# Patient Record
Sex: Male | Born: 1960 | Race: Black or African American | Hispanic: No | Smoking: Never smoker
Health system: Southern US, Community
[De-identification: ages and names within clinical notes are randomized; demographics above are authoritative.]

## PROBLEM LIST (undated history)

## (undated) DIAGNOSIS — E785 Hyperlipidemia, unspecified: Secondary | ICD-10-CM

## (undated) DIAGNOSIS — E663 Overweight: Secondary | ICD-10-CM

## (undated) DIAGNOSIS — Z9079 Acquired absence of other genital organ(s): Secondary | ICD-10-CM

## (undated) DIAGNOSIS — E559 Vitamin D deficiency, unspecified: Secondary | ICD-10-CM

## (undated) DIAGNOSIS — T7840XA Allergy, unspecified, initial encounter: Secondary | ICD-10-CM

## (undated) DIAGNOSIS — C801 Malignant (primary) neoplasm, unspecified: Secondary | ICD-10-CM

## (undated) HISTORY — DX: Allergy, unspecified, initial encounter: T78.40XA

## (undated) HISTORY — PX: PENILE PROSTHESIS IMPLANT: SHX240

## (undated) HISTORY — DX: Malignant (primary) neoplasm, unspecified: C80.1

## (undated) HISTORY — DX: Overweight: E66.3

## (undated) HISTORY — DX: Acquired absence of other genital organ(s): Z90.79

## (undated) HISTORY — DX: Vitamin D deficiency, unspecified: E55.9

## (undated) HISTORY — PX: TONSILLECTOMY: SUR1361

## (undated) HISTORY — DX: Hyperlipidemia, unspecified: E78.5

## (undated) HISTORY — PX: PROSTATECTOMY: SHX69

## (undated) HISTORY — PX: COLONOSCOPY: SHX174

## (undated) HISTORY — PX: TONSILECTOMY/ADENOIDECTOMY WITH MYRINGOTOMY: SHX6125

---

## 1999-11-18 ENCOUNTER — Encounter: Payer: Self-pay | Admitting: *Deleted

## 1999-11-18 ENCOUNTER — Ambulatory Visit (HOSPITAL_COMMUNITY): Admission: RE | Admit: 1999-11-18 | Discharge: 1999-11-18 | Payer: Self-pay | Admitting: *Deleted

## 2006-06-29 ENCOUNTER — Ambulatory Visit: Payer: Self-pay | Admitting: General Surgery

## 2006-09-18 DIAGNOSIS — C801 Malignant (primary) neoplasm, unspecified: Secondary | ICD-10-CM

## 2006-09-18 HISTORY — PX: PENILE PROSTHESIS IMPLANT: SHX240

## 2006-09-18 HISTORY — DX: Malignant (primary) neoplasm, unspecified: C80.1

## 2006-09-18 HISTORY — PX: LASER OF PROSTATE W/ GREEN LIGHT PVP: SHX1953

## 2007-09-19 HISTORY — PX: CYST EXCISION: SHX5701

## 2008-05-17 ENCOUNTER — Other Ambulatory Visit: Payer: Self-pay

## 2008-05-17 ENCOUNTER — Emergency Department: Payer: Self-pay | Admitting: Emergency Medicine

## 2011-07-31 LAB — HM COLONOSCOPY: HM Colonoscopy: NORMAL

## 2012-05-23 DIAGNOSIS — N529 Male erectile dysfunction, unspecified: Secondary | ICD-10-CM | POA: Insufficient documentation

## 2014-06-12 LAB — LIPID PANEL
Cholesterol: 116 mg/dL (ref 0–200)
HDL: 44 mg/dL (ref 35–70)
LDL Cholesterol: 62 mg/dL
Triglycerides: 48 mg/dL (ref 40–160)

## 2014-06-17 ENCOUNTER — Encounter: Payer: Self-pay | Admitting: *Deleted

## 2014-06-24 ENCOUNTER — Ambulatory Visit: Payer: Self-pay | Admitting: General Surgery

## 2014-07-16 ENCOUNTER — Ambulatory Visit (INDEPENDENT_AMBULATORY_CARE_PROVIDER_SITE_OTHER): Payer: BC Managed Care – PPO | Admitting: General Surgery

## 2014-07-16 ENCOUNTER — Encounter: Payer: Self-pay | Admitting: General Surgery

## 2014-07-16 VITALS — BP 124/70 | HR 74 | Resp 16 | Ht 69.0 in | Wt 182.0 lb

## 2014-07-16 DIAGNOSIS — L723 Sebaceous cyst: Secondary | ICD-10-CM

## 2014-07-16 MED ORDER — DOXYCYCLINE HYCLATE 100 MG PO CAPS: 100.0000 mg | ORAL_CAPSULE | Freq: Two times a day (BID) | ORAL | Status: DC

## 2014-07-16 NOTE — Progress Notes (Signed)
Patient ID: Jeffrey Harding, male   DOB: 1961/03/25, 53 y.o.   MRN: [id number]  Chief Complaint  Patient presents with  . Other    cyst on back    HPI Jeffrey Harding is a 53 y.o. male.  Here today for evaluation of a cyst on his back. He states it has been there for about 10 years. He states that over the past 6 months it is getting larger. His wife opened it up and it drained. He has had a cyst removed but it was lower on his back. He states it does drain occasionally.  HPI  Past Medical History  Diagnosis Date  . Cancer 2008    prostate  . Hyperlipidemia   . Allergy     Past Surgical History  Procedure Laterality Date  . Laser of prostate w/ green light pvp  2008  . Cyst excision  2009    back    History reviewed. No pertinent family history.  Social History History  Substance Use Topics  . Smoking status: Never Smoker   . Smokeless tobacco: Never Used  . Alcohol Use: Yes     Comment: occasionally    No Known Allergies  Current Outpatient Prescriptions  Medication Sig Dispense Refill  . atorvastatin (LIPITOR) 40 MG tablet       . fluticasone (FLONASE) 50 MCG/ACT nasal spray Place 1 spray into both nostrils daily.      Marland Kitchen loratadine (ALLERGY) 10 MG tablet Take 10 mg by mouth daily.      Marland Kitchen doxycycline (VIBRAMYCIN) 100 MG capsule Take 1 capsule (100 mg total) by mouth 2 (two) times daily.  20 capsule  0   No current facility-administered medications for this visit.    Review of Systems Review of Systems  Constitutional: Negative.   Respiratory: Negative.   Cardiovascular: Negative.     Blood pressure 124/70, pulse 74, resp. rate 16, height 5\' 9"  (1.753 m), weight 182 lb (82.555 kg).  Physical Exam Physical Exam  Constitutional: He is oriented to person, place, and time. He appears well-developed and well-nourished.  Eyes: Conjunctivae are normal. No scleral icterus.  Neck: Neck supple.  Cardiovascular: Normal rate, regular rhythm and normal heart sounds.    Pulmonary/Chest: Effort normal and breath sounds normal.  Lymphadenopathy:    He has no cervical adenopathy.  Neurological: He is alert and oriented to person, place, and time.  Skin: Skin is warm and dry.  2 cm cyst mid back with mild induration.    Data Reviewed none  Assessment    Skin cyst.    Plan    Reevaluate in 6 weeks and consider excision.       SANKAR,SEEPLAPUTHUR G 07/16/2014, 8:00 PM

## 2014-07-16 NOTE — Patient Instructions (Signed)
The patient is aware to call back for any questions or concerns.  

## 2014-08-27 ENCOUNTER — Ambulatory Visit: Payer: BC Managed Care – PPO | Admitting: General Surgery

## 2014-09-24 ENCOUNTER — Ambulatory Visit: Payer: BC Managed Care – PPO | Admitting: General Surgery

## 2014-10-08 ENCOUNTER — Encounter: Payer: Self-pay | Admitting: General Surgery

## 2014-10-08 ENCOUNTER — Ambulatory Visit (INDEPENDENT_AMBULATORY_CARE_PROVIDER_SITE_OTHER): Payer: BLUE CROSS/BLUE SHIELD | Admitting: General Surgery

## 2014-10-08 VITALS — BP 138/72 | HR 70 | Resp 12 | Ht 69.0 in | Wt 186.0 lb

## 2014-10-08 DIAGNOSIS — L723 Sebaceous cyst: Secondary | ICD-10-CM

## 2014-10-08 NOTE — Patient Instructions (Addendum)
Monitor the area if symptoms return call for an appointment for reevaluation.

## 2014-10-08 NOTE — Progress Notes (Signed)
He is here today for excision of a cyst on his back. No new complaints.   The area has completely resolved. No palpable or visible findings in that area.  Monitor the area if he notices the swelling occuring or symptoms return call for an appointment for reevaluation.

## 2015-05-25 ENCOUNTER — Encounter: Payer: Self-pay | Admitting: Family Medicine

## 2015-06-18 ENCOUNTER — Encounter: Payer: Self-pay | Admitting: Family Medicine

## 2015-06-18 ENCOUNTER — Ambulatory Visit (INDEPENDENT_AMBULATORY_CARE_PROVIDER_SITE_OTHER): Payer: BLUE CROSS/BLUE SHIELD | Admitting: Family Medicine

## 2015-06-18 VITALS — BP 108/56 | HR 80 | Temp 98.1°F | Resp 18 | Ht 68.5 in | Wt 174.4 lb

## 2015-06-18 DIAGNOSIS — Z8546 Personal history of malignant neoplasm of prostate: Secondary | ICD-10-CM | POA: Diagnosis not present

## 2015-06-18 DIAGNOSIS — Z0000 Encounter for general adult medical examination without abnormal findings: Secondary | ICD-10-CM | POA: Diagnosis not present

## 2015-06-18 DIAGNOSIS — Z9079 Acquired absence of other genital organ(s): Secondary | ICD-10-CM | POA: Insufficient documentation

## 2015-06-18 DIAGNOSIS — J3089 Other allergic rhinitis: Secondary | ICD-10-CM

## 2015-06-18 DIAGNOSIS — Z79899 Other long term (current) drug therapy: Secondary | ICD-10-CM | POA: Diagnosis not present

## 2015-06-18 DIAGNOSIS — E785 Hyperlipidemia, unspecified: Secondary | ICD-10-CM | POA: Diagnosis not present

## 2015-06-18 DIAGNOSIS — R634 Abnormal weight loss: Secondary | ICD-10-CM | POA: Diagnosis not present

## 2015-06-18 DIAGNOSIS — J302 Other seasonal allergic rhinitis: Secondary | ICD-10-CM | POA: Insufficient documentation

## 2015-06-18 DIAGNOSIS — E559 Vitamin D deficiency, unspecified: Secondary | ICD-10-CM | POA: Diagnosis not present

## 2015-06-18 MED ORDER — ASPIRIN EC 81 MG PO TBEC: 81.0000 mg | DELAYED_RELEASE_TABLET | Freq: Every day | ORAL | Status: DC

## 2015-06-18 MED ORDER — ATORVASTATIN CALCIUM 40 MG PO TABS
40.0000 mg | ORAL_TABLET | Freq: Every day | ORAL | Status: DC
Start: 2015-06-18 — End: 2016-07-04

## 2015-06-18 NOTE — Progress Notes (Signed)
Name: Jeffrey Harding   MRN: [id number]    DOB: 04-07-1961   Date:06/18/2015       Progress Note  Subjective  Chief Complaint  Chief Complaint  Patient presents with  . Annual Exam    HPI  Male Physical: he feels tired, his mother is in a nursing home in [place], has a stressful job, and sometimes does not get enough hour of sleep other times wakes up not feeling rested.   Hyperlipidemia: taking Atorvastatin daily and denies side effects. He denies chest pain or sob, no myalgia  ED: he has a penile pump, secondary to prostatectomy during treatment of prostate cancer  AR; currently not using medication, symptoms are worse in the Spring. Symptoms are usually nasal congestion, no rhinorrhea or itching.    Patient Active Problem List   Diagnosis Date Noted  . Allergic rhinitis 06/18/2015  . H/O malignant neoplasm of prostate 06/18/2015  . History of prostatectomy 06/18/2015  . Vitamin D deficiency 06/18/2015  . Dyslipidemia 06/18/2015  . ED (erectile dysfunction) of organic origin 05/23/2012    Past Surgical History  Procedure Laterality Date  . Laser of prostate w/ green light pvp  2008  . Cyst excision  2009    back  . Penile prosthesis implant    . Prostatectomy      Family History  Problem Relation Age of Onset  . Hypertension Mother   . Hyperlipidemia Mother   . Heart disease Mother   . Diabetes Mother   . Heart disease Father   . Hyperlipidemia Father   . Hypertension Father   . Cancer Father     prostate  . Hypertension Sister   . Hyperlipidemia Sister     Social History   Social History  . Marital Status: Married    Spouse Name: N/A  . Number of Children: N/A  . Years of Education: N/A   Occupational History  . Not on file.   Social History Main Topics  . Smoking status: Never Smoker   . Smokeless tobacco: Never Used  . Alcohol Use: 0.0 oz/week    0 Standard drinks or equivalent per week     Comment: rarely  . Drug Use: No  . Sexual  Activity:    Partners: Female   Other Topics Concern  . Not on file   Social History Narrative     Current outpatient prescriptions:  .  atorvastatin (LIPITOR) 40 MG tablet, Take 1 tablet (40 mg total) by mouth daily., Disp: 90 tablet, Rfl: 3 .  aspirin EC 81 MG tablet, Take 1 tablet (81 mg total) by mouth daily., Disp: 30 tablet, Rfl: 0 .  fluticasone (FLONASE) 50 MCG/ACT nasal spray, Place 1 spray into both nostrils daily., Disp: , Rfl:  .  loratadine (ALLERGY) 10 MG tablet, Take 10 mg by mouth daily., Disp: , Rfl:   No Known Allergies   ROS  Constitutional: Negative for fever positive for  weight change ( lost 10 lbs since last visit - he states he has changed his diet and is exercising between 2  -4 times weekly .  Respiratory: Negative for cough and shortness of breath.   Cardiovascular: Negative for chest pain or palpitations.  Gastrointestinal: Negative for abdominal pain, no bowel changes.  Musculoskeletal: Negative for gait problem or joint swelling.  Skin: Negative for rash.  Neurological: Negative for dizziness or headache.  No other specific complaints in a complete review of systems (except as listed in HPI above).  Objective  Filed Vitals:   06/18/15 1423  BP: 108/56  Pulse: 80  Temp: 98.1 F (36.7 C)  TempSrc: Oral  Resp: 18  Height: 5' 8.5" (1.74 m)  Weight: 174 lb 6.4 oz (79.107 kg)  SpO2: 98%    Body mass index is 26.13 kg/(m^2).  Physical Exam  Constitutional: Patient appears well-developed and well-nourished. No distress.  HENT: Head: Normocephalic and atraumatic. Ears: B TMs ok, no erythema or effusion; Nose: Nose normal. Mouth/Throat: Oropharynx is clear and moist. No oropharyngeal exudate.  Eyes: Conjunctivae and EOM are normal. Pupils are equal, round, and reactive to light. No scleral icterus.  Neck: Normal range of motion. Neck supple. No JVD present. No thyromegaly present.  Cardiovascular: Normal rate, regular rhythm and normal heart  sounds.  No murmur heard. No BLE edema. Pulmonary/Chest: Effort normal and breath sounds normal. No respiratory distress. Abdominal: Soft. Bowel sounds are normal, no distension. There is no tenderness. no masses MALE GENITALIA: sees Urologist  RECTAL: seen by Urologist  Musculoskeletal: Normal range of motion, no joint effusions. No gross deformities Neurological: he is alert and oriented to person, place, and time. No cranial nerve deficit. Coordination, balance, strength, speech and gait are normal.  Skin: Skin is warm and dry. No rash noted. No erythema.  Psychiatric: Patient has a normal mood and affect. behavior is normal. Judgment and thought content normal.  PHQ2/9: Depression screen PHQ 2/9 06/18/2015  Decreased Interest 0  Down, Depressed, Hopeless 0  PHQ - 2 Score 0     Fall Risk: Fall Risk  06/18/2015  Falls in the past year? No    Functional Status Survey: Is the patient deaf or have difficulty hearing?: No Does the patient have difficulty seeing, even when wearing glasses/contacts?: Yes (reading glasses) Does the patient have difficulty concentrating, remembering, or making decisions?: No Does the patient have difficulty walking or climbing stairs?: No Does the patient have difficulty dressing or bathing?: No Does the patient have difficulty doing errands alone such as visiting a doctor's office or shopping?: No    Assessment & Plan  1. Encounter for routine history and physical exam for male  Discussed importance of 150 minutes of physical activity weekly, eat two servings of fish weekly, eat one serving of tree nuts ( cashews, pistachios, pecans, almonds.Marland Kitchen) every other day, eat 6 servings of fruit/vegetables daily and drink plenty of water and avoid sweet beverages.   2. H/O malignant neoplasm of prostate  Going to [facility] to see Oncologist and Urologist yearly   3. Vitamin D deficiency  - Vit D  25 hydroxy (rtn osteoporosis monitoring)  4. Dyslipidemia  -  atorvastatin (LIPITOR) 40 MG tablet; Take 1 tablet (40 mg total) by mouth daily.  Dispense: 90 tablet; Refill: 3 - Lipid panel  5. Weight loss  - TSH - CBC with Differential/Platelet  6. Felicetti-term use of high-risk medication  - Comprehensive metabolic panel

## 2015-06-19 LAB — COMPREHENSIVE METABOLIC PANEL
A/G RATIO: 1.6 (ref 1.1–2.5)
ALBUMIN: 4.2 g/dL (ref 3.5–5.5)
ALT: 27 IU/L (ref 0–44)
AST: 18 IU/L (ref 0–40)
Alkaline Phosphatase: 78 IU/L (ref 39–117)
BUN / CREAT RATIO: 9 (ref 9–20)
BUN: 9 mg/dL (ref 6–24)
Bilirubin Total: 0.7 mg/dL (ref 0.0–1.2)
CALCIUM: 9.1 mg/dL (ref 8.7–10.2)
CO2: 26 mmol/L (ref 18–29)
CREATININE: 0.96 mg/dL (ref 0.76–1.27)
Chloride: 99 mmol/L (ref 97–108)
GFR calc Af Amer: 102 mL/min/{1.73_m2} (ref >59)
GFR, EST NON AFRICAN AMERICAN: 89 mL/min/{1.73_m2} (ref >59)
GLOBULIN, TOTAL: 2.7 g/dL (ref 1.5–4.5)
Glucose: 93 mg/dL (ref 65–99)
POTASSIUM: 3.9 mmol/L (ref 3.5–5.2)
SODIUM: 139 mmol/L (ref 134–144)
Total Protein: 6.9 g/dL (ref 6.0–8.5)

## 2015-06-19 LAB — CBC WITH DIFFERENTIAL/PLATELET
BASOS ABS: 0 10*3/uL (ref 0.0–0.2)
Basos: 1 %
EOS (ABSOLUTE): 0 10*3/uL (ref 0.0–0.4)
Eos: 1 %
HEMATOCRIT: 42.3 % (ref 37.5–51.0)
Hemoglobin: 14.3 g/dL (ref 12.6–17.7)
IMMATURE GRANULOCYTES: 0 %
Immature Grans (Abs): 0 10*3/uL (ref 0.0–0.1)
LYMPHS ABS: 1.9 10*3/uL (ref 0.7–3.1)
Lymphs: 43 %
MCH: 30.5 pg (ref 26.6–33.0)
MCHC: 33.8 g/dL (ref 31.5–35.7)
MCV: 90 fL (ref 79–97)
MONOS ABS: 0.3 10*3/uL (ref 0.1–0.9)
Monocytes: 8 %
NEUTROS PCT: 47 %
Neutrophils Absolute: 2.1 10*3/uL (ref 1.4–7.0)
PLATELETS: 208 10*3/uL (ref 150–379)
RBC: 4.69 x10E6/uL (ref 4.14–5.80)
RDW: 14.1 % (ref 12.3–15.4)
WBC: 4.3 10*3/uL (ref 3.4–10.8)

## 2015-06-19 LAB — LIPID PANEL
CHOL/HDL RATIO: 2.9 ratio (ref 0.0–5.0)
Cholesterol, Total: 92 mg/dL — ABNORMAL LOW (ref 100–199)
HDL: 32 mg/dL — ABNORMAL LOW (ref >39)
LDL CALC: 49 mg/dL (ref 0–99)
Triglycerides: 56 mg/dL (ref 0–149)
VLDL Cholesterol Cal: 11 mg/dL (ref 5–40)

## 2015-06-19 LAB — VITAMIN D 25 HYDROXY (VIT D DEFICIENCY, FRACTURES): VIT D 25 HYDROXY: 30.6 ng/mL (ref 30.0–100.0)

## 2015-06-19 LAB — TSH: TSH: 0.826 u[IU]/mL (ref 0.450–4.500)

## 2015-06-21 NOTE — Progress Notes (Signed)
Patient notified by phone.

## 2015-08-15 ENCOUNTER — Emergency Department
Admission: EM | Admit: 2015-08-15 | Discharge: 2015-08-15 | Disposition: A | Discharge status: Home / Self Care | Payer: BLUE CROSS/BLUE SHIELD | Attending: Emergency Medicine | Admitting: Emergency Medicine

## 2015-08-15 ENCOUNTER — Emergency Department: Payer: BLUE CROSS/BLUE SHIELD

## 2015-08-15 ENCOUNTER — Encounter: Payer: Self-pay | Admitting: Emergency Medicine

## 2015-08-15 DIAGNOSIS — Z7982 Long term (current) use of aspirin: Secondary | ICD-10-CM | POA: Insufficient documentation

## 2015-08-15 DIAGNOSIS — R079 Chest pain, unspecified: Secondary | ICD-10-CM | POA: Diagnosis present

## 2015-08-15 DIAGNOSIS — Z7951 Long term (current) use of inhaled steroids: Secondary | ICD-10-CM | POA: Diagnosis not present

## 2015-08-15 DIAGNOSIS — Z79899 Other long term (current) drug therapy: Secondary | ICD-10-CM | POA: Insufficient documentation

## 2015-08-15 LAB — LIPASE, BLOOD: Lipase: 22 U/L (ref 11–51)

## 2015-08-15 LAB — CBC
HEMATOCRIT: 44.9 % (ref 40.0–52.0)
HEMOGLOBIN: 14.6 g/dL (ref 13.0–18.0)
MCH: 29.9 pg (ref 26.0–34.0)
MCHC: 32.6 g/dL (ref 32.0–36.0)
MCV: 91.6 fL (ref 80.0–100.0)
Platelets: 163 10*3/uL (ref 150–440)
RBC: 4.9 MIL/uL (ref 4.40–5.90)
RDW: 14.5 % (ref 11.5–14.5)
WBC: 4.7 10*3/uL (ref 3.8–10.6)

## 2015-08-15 LAB — COMPREHENSIVE METABOLIC PANEL
ALT: 23 U/L (ref 17–63)
ANION GAP: 7 (ref 5–15)
AST: 18 U/L (ref 15–41)
Albumin: 4.1 g/dL (ref 3.5–5.0)
Alkaline Phosphatase: 60 U/L (ref 38–126)
BUN: 14 mg/dL (ref 6–20)
CHLORIDE: 106 mmol/L (ref 101–111)
CO2: 27 mmol/L (ref 22–32)
CREATININE: 0.96 mg/dL (ref 0.61–1.24)
Calcium: 9 mg/dL (ref 8.9–10.3)
Glucose, Bld: 110 mg/dL — ABNORMAL HIGH (ref 65–99)
POTASSIUM: 4 mmol/L (ref 3.5–5.1)
SODIUM: 140 mmol/L (ref 135–145)
Total Bilirubin: 0.9 mg/dL (ref 0.3–1.2)
Total Protein: 7.3 g/dL (ref 6.5–8.1)

## 2015-08-15 LAB — TROPONIN I

## 2015-08-15 MED ORDER — GI COCKTAIL ~~LOC~~
30.0000 mL | Freq: Once | ORAL | Status: AC
  Administered 2015-08-15: 30 mL via ORAL
  Filled 2015-08-15: qty 30

## 2015-08-15 MED ORDER — PANTOPRAZOLE SODIUM 40 MG PO TBEC: 40.0000 mg | DELAYED_RELEASE_TABLET | Freq: Every day | ORAL | Status: DC

## 2015-08-15 MED ORDER — SUCRALFATE 1 G PO TABS: 1.0000 g | ORAL_TABLET | Freq: Four times a day (QID) | ORAL | Status: DC

## 2015-08-15 NOTE — ED Notes (Signed)
Discussed discharge instructions, prescriptions, and follow-up care with patient. No questions or concerns at this time. Pt stable at discharge.  

## 2015-08-15 NOTE — ED Provider Notes (Signed)
[facility] Emergency Department Provider Note    ____________________________________________  Time seen: On EMS arrival  I have reviewed the triage vital signs and the nursing notes.   HISTORY  Chief Complaint Chest Pain   History limited by: Not Limited   HPI Jeffrey Harding is a 54 y.o. male with no significant past medical history who presents to the emergency department today because of concerns for chest pain. The patient states that the pain started this morning. It started suddenly. It started just prior to presentation. Patient describes the pain as being severe and located in his central lower chest. He denies any radiation. He denies any associated shortness of breath. It is somewhat worse with sitting up. He states it is somewhat worse with palpation. He denies any fevers.   Past Medical History  Diagnosis Date  . Hyperlipidemia   . Allergy   . Cancer [facility]) 2008    prostate  . Over weight   . Vitamin D deficiency   . H/O prostatectomy     Patient Active Problem List   Diagnosis Date Noted  . Allergic rhinitis 06/18/2015  . H/O malignant neoplasm of prostate 06/18/2015  . History of prostatectomy 06/18/2015  . Vitamin D deficiency 06/18/2015  . Dyslipidemia 06/18/2015  . ED (erectile dysfunction) of organic origin 05/23/2012    Past Surgical History  Procedure Laterality Date  . Laser of prostate w/ green light pvp  2008  . Cyst excision  2009    back  . Penile prosthesis implant    . Prostatectomy      Current Outpatient Rx  Name  Route  Sig  Dispense  Refill  . aspirin EC 81 MG tablet   Oral   Take 1 tablet (81 mg total) by mouth daily.   30 tablet   0   . atorvastatin (LIPITOR) 40 MG tablet   Oral   Take 1 tablet (40 mg total) by mouth daily.   90 tablet   3   . fluticasone (FLONASE) 50 MCG/ACT nasal spray   Each Nare   Place 1 spray into both nostrils daily.         Marland Kitchen loratadine (ALLERGY) 10 MG tablet  Oral   Take 10 mg by mouth daily.           Allergies Review of patient's allergies indicates no known allergies.  Family History  Problem Relation Age of Onset  . Hypertension Mother   . Hyperlipidemia Mother   . Heart disease Mother   . Diabetes Mother   . Heart disease Father   . Hyperlipidemia Father   . Hypertension Father   . Cancer Father     prostate  . Hypertension Sister   . Hyperlipidemia Sister     Social History Social History  Substance Use Topics  . Smoking status: Never Smoker   . Smokeless tobacco: Never Used  . Alcohol Use: 0.0 oz/week    0 Standard drinks or equivalent per week     Comment: rarely    Review of Systems  Constitutional: Negative for fever. Cardiovascular: Positive for chest pain. Respiratory: Negative for shortness of breath. Gastrointestinal: Negative for abdominal pain, vomiting and diarrhea. Genitourinary: Negative for dysuria. Musculoskeletal: Negative for back pain. Skin: Negative for rash. Neurological: Negative for headaches, focal weakness or numbness.  10-point ROS otherwise negative.  ____________________________________________   PHYSICAL EXAM:  VITAL SIGNS: ED Triage Vitals  Enc Vitals Group     BP 08/15/15 0920 163/96  mmHg     Pulse Rate 08/15/15 0920 70     Resp 08/15/15 0920 16     Temp 08/15/15 0920 97.9 F (36.6 C)     Temp Source 08/15/15 0920 Oral     SpO2 08/15/15 0920 98 %     Weight 08/15/15 0920 170 lb (77.111 kg)     Height 08/15/15 0920 5\' 9"  (1.753 m)   Constitutional: Alert and oriented. Well appearing and in no distress. Eyes: Conjunctivae are normal. PERRL. Normal extraocular movements. ENT   Head: Normocephalic and atraumatic.   Nose: No congestion/rhinnorhea.   Mouth/Throat: Mucous membranes are moist.   Neck: No stridor. Hematological/Lymphatic/Immunilogical: No cervical lymphadenopathy. Cardiovascular: Normal rate, regular rhythm.  No murmurs, rubs, or gallops. Mild  tenderness to palpation in the low chest. Respiratory: Normal respiratory effort without tachypnea nor retractions. Breath sounds are clear and equal bilaterally. No wheezes/rales/rhonchi. Gastrointestinal: Soft and nontender. No distention.  Genitourinary: Deferred Musculoskeletal: Normal range of motion in all extremities. No joint effusions.  No lower extremity tenderness nor edema. Neurologic:  Normal speech and language. No gross focal neurologic deficits are appreciated.  Skin:  Skin is warm, dry and intact. No rash noted. Psychiatric: Mood and affect are normal. Speech and behavior are normal. Patient exhibits appropriate insight and judgment.  ____________________________________________    LABS (pertinent positives/negatives)  Labs Reviewed  COMPREHENSIVE METABOLIC PANEL - Abnormal; Notable for the following:    Glucose, Bld 110 (*)    All other components within normal limits  TROPONIN I  CBC  LIPASE, BLOOD  TROPONIN I     ____________________________________________   EKG  I, Nance Pear, attending physician, personally viewed and interpreted this EKG  EKG Time: 0926 Rate: 75 Rhythm: NSR Axis: normal Intervals: qtc 420 QRS: narrow, q waves V1, V2 ST changes: no st elevation Impression: abnormal ekg ____________________________________________    RADIOLOGY  CXR IMPRESSION: No active cardiopulmonary disease.   ____________________________________________   PROCEDURES  Procedure(s) performed: None  Critical Care performed: No  ____________________________________________   INITIAL IMPRESSION / ASSESSMENT AND PLAN / ED COURSE  Pertinent labs & imaging results that were available during my care of the patient were reviewed by me and considered in my medical decision making (see chart for details).  Patient presented to the emergency department today because of concerns for chest pain. Patient himself with a somewhat low risk however does  have some family history of heart disease. Because of this and the abundance of caution I check 2 troponins. These were both negative. Patient did state he felt better after the GI cocktail. I will prescribe antacids and sucralfate.  ____________________________________________   FINAL CLINICAL IMPRESSION(S) / ED DIAGNOSES  Final diagnoses:  Chest pain, unspecified chest pain type     Nance Pear, MD 08/15/15 1414

## 2015-08-15 NOTE — Discharge Instructions (Signed)
Please seek medical attention for any high fevers, chest pain, shortness of breath, change in behavior, persistent vomiting, bloody stool or any other new or concerning symptoms. ° ° °Nonspecific Chest Pain  °Chest pain can be caused by many different conditions. There is always a chance that your pain could be related to something serious, such as a heart attack or a blood clot in your lungs. Chest pain can also be caused by conditions that are not life-threatening. If you have chest pain, it is very important to follow up with your health care provider. °CAUSES  °Chest pain can be caused by: °· Heartburn. °· Pneumonia or bronchitis. °· Anxiety or stress. °· Inflammation around your heart (pericarditis) or lung (pleuritis or pleurisy). °· A blood clot in your lung. °· A collapsed lung (pneumothorax). It can develop suddenly on its own (spontaneous pneumothorax) or from trauma to the chest. °· Shingles infection (varicella-zoster virus). °· Heart attack. °· Damage to the bones, muscles, and cartilage that make up your chest wall. This can include: °¨ Bruised bones due to injury. °¨ Strained muscles or cartilage due to frequent or repeated coughing or overwork. °¨ Fracture to one or more ribs. °¨ Sore cartilage due to inflammation (costochondritis). °RISK FACTORS  °Risk factors for chest pain may include: °· Activities that increase your risk for trauma or injury to your chest. °· Respiratory infections or conditions that cause frequent coughing. °· Medical conditions or overeating that can cause heartburn. °· Heart disease or family history of heart disease. °· Conditions or health behaviors that increase your risk of developing a blood clot. °· Having had chicken pox (varicella zoster). °SIGNS AND SYMPTOMS °Chest pain can feel like: °· Burning or tingling on the surface of your chest or deep in your chest. °· Crushing, pressure, aching, or squeezing pain. °· Dull or sharp pain that is worse when you move, cough, or  take a deep breath. °· Pain that is also felt in your back, neck, shoulder, or arm, or pain that spreads to any of these areas. °Your chest pain may come and go, or it may stay constant. °DIAGNOSIS °Lab tests or other studies may be needed to find the cause of your pain. Your health care provider may have you take a test called an ambulatory ECG (electrocardiogram). An ECG records your heartbeat patterns at the time the test is performed. You may also have other tests, such as: °· Transthoracic echocardiogram (TTE). During echocardiography, sound waves are used to create a picture of all of the heart structures and to look at how blood flows through your heart. °· Transesophageal echocardiogram (TEE). This is a more advanced imaging test that obtains images from inside your body. It allows your health care provider to see your heart in finer detail. °· Cardiac monitoring. This allows your health care provider to monitor your heart rate and rhythm in real time. °· Holter monitor. This is a portable device that records your heartbeat and can help to diagnose abnormal heartbeats. It allows your health care provider to track your heart activity for several days, if needed. °· Stress tests. These can be done through exercise or by taking medicine that makes your heart beat more quickly. °· Blood tests. °· Imaging tests. °TREATMENT  °Your treatment depends on what is causing your chest pain. Treatment may include: °· Medicines. These may include: °¨ Acid blockers for heartburn. °¨ Anti-inflammatory medicine. °¨ Pain medicine for inflammatory conditions. °¨ Antibiotic medicine, if an infection is present. °¨ Medicines   to dissolve blood clots. °¨ Medicines to treat coronary artery disease. °· Supportive care for conditions that do not require medicines. This may include: °¨ Resting. °¨ Applying heat or cold packs to injured areas. °¨ Limiting activities until pain decreases. °HOME CARE INSTRUCTIONS °· If you were prescribed  an antibiotic medicine, finish it all even if you start to feel better. °· Avoid any activities that bring on chest pain. °· Do not use any tobacco products, including cigarettes, chewing tobacco, or electronic cigarettes. If you need help quitting, ask your health care provider. °· Do not drink alcohol. °· Take medicines only as directed by your health care provider. °· Keep all follow-up visits as directed by your health care provider. This is important. This includes any further testing if your chest pain does not go away. °· If heartburn is the cause for your chest pain, you may be told to keep your head raised (elevated) while sleeping. This reduces the chance that acid will go from your stomach into your esophagus. °· Make lifestyle changes as directed by your health care provider. These may include: °¨ Getting regular exercise. Ask your health care provider to suggest some activities that are safe for you. °¨ Eating a heart-healthy diet. A registered dietitian can help you to learn healthy eating options. °¨ Maintaining a healthy weight. °¨ Managing diabetes, if necessary. °¨ Reducing stress. °SEEK MEDICAL CARE IF: °· Your chest pain does not go away after treatment. °· You have a rash with blisters on your chest. °· You have a fever. °SEEK IMMEDIATE MEDICAL CARE IF:  °· Your chest pain is worse. °· You have an increasing cough, or you cough up blood. °· You have severe abdominal pain. °· You have severe weakness. °· You faint. °· You have chills. °· You have sudden, unexplained chest discomfort. °· You have sudden, unexplained discomfort in your arms, back, neck, or jaw. °· You have shortness of breath at any time. °· You suddenly start to sweat, or your skin gets clammy. °· You feel nauseous or you vomit. °· You suddenly feel light-headed or dizzy. °· Your heart begins to beat quickly, or it feels like it is skipping beats. °These symptoms may represent a serious problem that is an emergency. Do not wait to  see if the symptoms will go away. Get medical help right away. Call your local emergency services (911 in the [place] Do not drive yourself to the hospital. °  °This information is not intended to replace advice given to you by your health care provider. Make sure you discuss any questions you have with your health care provider. °  °Document Released: 06/14/2005 Document Revised: 09/25/2014 Document Reviewed: 04/10/2014 °Elsevier Interactive Patient Education ©2016 [employer] ° °

## 2015-08-15 NOTE — ED Notes (Signed)
Pt given 1 inch Nitro paste PTA by EMS for hypertension.

## 2015-08-15 NOTE — ED Notes (Signed)
Pt to ED via EMS c/o sharp, midsternal chest pain, non-radiating. No c/o shortness of breath, no nausea. Pt given 4 baby aspirin PTA by EMS.

## 2016-06-23 ENCOUNTER — Encounter: Payer: BLUE CROSS/BLUE SHIELD | Admitting: Family Medicine

## 2016-07-04 ENCOUNTER — Other Ambulatory Visit: Payer: Self-pay | Admitting: Family Medicine

## 2016-07-04 DIAGNOSIS — E785 Hyperlipidemia, unspecified: Secondary | ICD-10-CM

## 2016-07-04 NOTE — Telephone Encounter (Signed)
Patient requesting refill of Atorvastatin.   

## 2016-07-07 ENCOUNTER — Encounter: Payer: Self-pay | Admitting: Family Medicine

## 2016-07-07 ENCOUNTER — Ambulatory Visit (INDEPENDENT_AMBULATORY_CARE_PROVIDER_SITE_OTHER): Payer: BLUE CROSS/BLUE SHIELD | Admitting: Family Medicine

## 2016-07-07 VITALS — BP 110/72 | HR 75 | Temp 98.0°F | Resp 16 | Ht 69.0 in | Wt 177.8 lb

## 2016-07-07 DIAGNOSIS — E559 Vitamin D deficiency, unspecified: Secondary | ICD-10-CM | POA: Diagnosis not present

## 2016-07-07 DIAGNOSIS — Z0000 Encounter for general adult medical examination without abnormal findings: Secondary | ICD-10-CM

## 2016-07-07 DIAGNOSIS — Z0001 Encounter for general adult medical examination with abnormal findings: Secondary | ICD-10-CM

## 2016-07-07 DIAGNOSIS — Z131 Encounter for screening for diabetes mellitus: Secondary | ICD-10-CM

## 2016-07-07 DIAGNOSIS — Z79899 Other long term (current) drug therapy: Secondary | ICD-10-CM

## 2016-07-07 DIAGNOSIS — Z23 Encounter for immunization: Secondary | ICD-10-CM | POA: Diagnosis not present

## 2016-07-07 DIAGNOSIS — E785 Hyperlipidemia, unspecified: Secondary | ICD-10-CM | POA: Diagnosis not present

## 2016-07-07 LAB — COMPLETE METABOLIC PANEL WITH GFR
ALBUMIN: 4.1 g/dL (ref 3.6–5.1)
ALT: 20 U/L (ref 9–46)
AST: 17 U/L (ref 10–35)
Alkaline Phosphatase: 61 U/L (ref 40–115)
BILIRUBIN TOTAL: 0.9 mg/dL (ref 0.2–1.2)
BUN: 10 mg/dL (ref 7–25)
CALCIUM: 8.9 mg/dL (ref 8.6–10.3)
CO2: 28 mmol/L (ref 20–31)
CREATININE: 1.09 mg/dL (ref 0.70–1.33)
Chloride: 102 mmol/L (ref 98–110)
GFR, EST AFRICAN AMERICAN: 87 mL/min (ref >=60)
GFR, Est Non African American: 75 mL/min (ref >=60)
Glucose, Bld: 87 mg/dL (ref 65–99)
Potassium: 4.1 mmol/L (ref 3.5–5.3)
Sodium: 139 mmol/L (ref 135–146)
TOTAL PROTEIN: 7 g/dL (ref 6.1–8.1)

## 2016-07-07 LAB — LIPID PANEL
Cholesterol: 111 mg/dL — ABNORMAL LOW (ref 125–200)
HDL: 47 mg/dL (ref >=40)
LDL CALC: 54 mg/dL (ref <130)
TRIGLYCERIDES: 52 mg/dL (ref <150)
Total CHOL/HDL Ratio: 2.4 Ratio (ref <=5.0)
VLDL: 10 mg/dL (ref <30)

## 2016-07-07 LAB — CBC WITH DIFFERENTIAL/PLATELET
BASOS PCT: 0 %
Basophils Absolute: 0 cells/uL (ref 0–200)
EOS ABS: 45 {cells}/uL (ref 15–500)
Eosinophils Relative: 1 %
HEMATOCRIT: 44.5 % (ref 38.5–50.0)
HEMOGLOBIN: 14.9 g/dL (ref 13.2–17.1)
LYMPHS ABS: 1530 {cells}/uL (ref 850–3900)
Lymphocytes Relative: 34 %
MCH: 30.6 pg (ref 27.0–33.0)
MCHC: 33.5 g/dL (ref 32.0–36.0)
MCV: 91.4 fL (ref 80.0–100.0)
MONO ABS: 360 {cells}/uL (ref 200–950)
MPV: 10.9 fL (ref 7.5–12.5)
Monocytes Relative: 8 %
NEUTROS ABS: 2565 {cells}/uL (ref 1500–7800)
Neutrophils Relative %: 57 %
PLATELETS: 219 10*3/uL (ref 140–400)
RBC: 4.87 MIL/uL (ref 4.20–5.80)
RDW: 14.2 % (ref 11.0–15.0)
WBC: 4.5 10*3/uL (ref 3.8–10.8)

## 2016-07-07 NOTE — Progress Notes (Signed)
Name: Jeffrey Harding   MRN: [id number]    DOB: 04/17/1961   Date:07/07/2016       Progress Note  Subjective  Chief Complaint  Chief Complaint  Patient presents with  . 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 Sexually active, dating for the past 5 years , he uses a pump since prostate cancer. He still sees Urologist   Hyperlipidemia: taking medication daily, denies muscle aches or chest pain  Vitamin D deficiency: he is no longer taking otc supplementation   Patient Active Problem List   Diagnosis Date Noted  . Allergic rhinitis 06/18/2015  . H/O malignant neoplasm of prostate 06/18/2015  . History of prostatectomy 06/18/2015  . Vitamin D deficiency 06/18/2015  . Dyslipidemia 06/18/2015  . 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 CYST EXCISION  2009   back  . LASER OF PROSTATE W/ GREEN LIGHT PVP  2008  . PENILE PROSTHESIS IMPLANT    . PROSTATECTOMY      Family History  Problem Relation Age of Onset  . Hypertension Mother   . Hyperlipidemia Mother   . Heart disease Mother   . Diabetes Mother   . Heart disease Father   . Hyperlipidemia Father   . Hypertension Father   . Cancer Father     prostate  . Hypertension Sister   . Hyperlipidemia Sister     Social History   Social History  . Marital status: Divorced    Spouse name: N/A  . Number of children: N/A  . Years of education: N/A   Occupational History  . Not on file.   Social History Main Topics  . Smoking status: Never Smoker  . Smokeless tobacco: Never Used  . Alcohol use 0.0 oz/week     Comment: rarely  . Drug use: No  . Sexual activity: Yes    Partners: Female   Other Topics Concern  . Not on file   Social History Narrative  . No narrative on file     Current Outpatient Prescriptions:  .   aspirin EC 81 MG tablet, Take 1 tablet (81 mg total) by mouth daily., Disp: 30 tablet, Rfl: 0 .  atorvastatin (LIPITOR) 40 MG tablet, TAKE 1 TABLET (40 MG TOTAL) BY MOUTH DAILY., Disp: 90 tablet, Rfl: 3  No Known Allergies   ROS  Constitutional: Negative for fever, positive for weight change - lifting weight .  Respiratory: Negative for cough and shortness of breath.   Cardiovascular: Negative for chest pain or palpitations.  Gastrointestinal: Negative for abdominal pain, no bowel changes.  Musculoskeletal: Negative for gait problem or joint swelling.  Skin: Negative for rash.  Neurological: Negative for dizziness or headache.  No other specific complaints in a complete review of systems (except as listed in HPI above).  Objective  Vitals:   07/07/16 1059  BP: 110/72  Pulse: 75  Resp: 16  Temp: 98 F (36.7 C)  TempSrc: Oral  SpO2: 96%  Weight: 177 lb 12.8 oz (80.6 kg)  Height: 5\' 9"  (1.753 m)    Body mass index is 26.26 kg/m.  Physical Exam  Constitutional: Patient appears well-developed and well-nourished. No distress.  HENT: Head: Normocephalic and atraumatic. Ears: B TMs ok, no erythema or effusion; Nose: Nose normal. Mouth/Throat: Oropharynx is  clear and moist. No oropharyngeal exudate.  Eyes: Conjunctivae and EOM are normal. Pupils are equal, round, and reactive to light. No scleral icterus.  Neck: Normal range of motion. Neck supple. No JVD present. No thyromegaly present.  Cardiovascular: Normal rate, regular rhythm and normal heart sounds.  No murmur heard. No BLE edema. Pulmonary/Chest: Effort normal and breath sounds normal. No respiratory distress. Abdominal: Soft. Bowel sounds are normal, no distension. There is no tenderness. no masses MALE GENITALIA: gets yearly exam at [facility] 11/2015 RECTAL: had complete exam done by oncologist at [facility] 11/2015 Musculoskeletal: Normal range of motion, no joint effusions. No gross deformities Neurological: he is alert and  oriented to person, place, and time. No cranial nerve deficit. Coordination, balance, strength, speech and gait are normal.  Skin: Skin is warm and dry. No rash noted. No erythema.  Psychiatric: Patient has a normal mood and affect. behavior is normal. Judgment and thought content normal.  PHQ2/9: Depression screen PHQ 2/9 06/18/2015  Decreased Interest 0  Down, Depressed, Hopeless 0  PHQ - 2 Score 0     Fall Risk: Fall Risk  06/18/2015  Falls in the past year? No    Assessment & Plan  1. Encounter for routine history and physical exam for male  Discussed importance of 150 minutes of physical activity weekly, eat two servings of fish weekly, eat one serving of tree nuts ( cashews, pistachios, pecans, almonds.Marland Kitchen) every other day, eat 6 servings of fruit/vegetables daily and drink plenty of water and avoid sweet beverages.  - CBC with Differential/Platelet - COMPLETE METABOLIC PANEL WITH GFR - Lipid panel - Hemoglobin A1c - VITAMIN D 25 Hydroxy (Vit-D Deficiency, Fractures)  2. Vitamin D deficiency  - VITAMIN D 25 Hydroxy (Vit-D Deficiency, Fractures)  3. Dyslipidemia  - Lipid panel  4. Economos-term use of high-risk medication  - CBC with Differential/Platelet - COMPLETE METABOLIC PANEL WITH GFR  5. Diabetes mellitus screening  - Hemoglobin A1c  6. Needs flu shot  refused

## 2016-07-08 LAB — HEMOGLOBIN A1C
Hgb A1c MFr Bld: 5.5 % (ref <5.7)
Mean Plasma Glucose: 111 mg/dL

## 2016-07-08 LAB — VITAMIN D 25 HYDROXY (VIT D DEFICIENCY, FRACTURES): VIT D 25 HYDROXY: 29 ng/mL — AB (ref 30–100)

## 2016-09-22 ENCOUNTER — Encounter: Payer: BLUE CROSS/BLUE SHIELD | Admitting: Family Medicine

## 2017-02-23 DIAGNOSIS — C61 Malignant neoplasm of prostate: Secondary | ICD-10-CM | POA: Diagnosis not present

## 2017-02-23 DIAGNOSIS — Z8546 Personal history of malignant neoplasm of prostate: Secondary | ICD-10-CM | POA: Diagnosis not present

## 2017-07-03 ENCOUNTER — Other Ambulatory Visit: Payer: Self-pay | Admitting: Family Medicine

## 2017-07-03 DIAGNOSIS — E785 Hyperlipidemia, unspecified: Secondary | ICD-10-CM

## 2017-07-03 NOTE — Telephone Encounter (Signed)
Refill Request for Cholesterol medication for Atorvastatin to [facility] for 30 days until his appointment: 07/13/17  Last physical:  Lab Results  Component Value Date   CHOL 111 (L) 07/07/2016   HDL 47 07/07/2016   LDLCALC 54 07/07/2016   TRIG 52 07/07/2016   CHOLHDL 2.4 07/07/2016

## 2017-07-13 ENCOUNTER — Ambulatory Visit (INDEPENDENT_AMBULATORY_CARE_PROVIDER_SITE_OTHER): Payer: BLUE CROSS/BLUE SHIELD | Admitting: Family Medicine

## 2017-07-13 ENCOUNTER — Encounter: Payer: Self-pay | Admitting: Family Medicine

## 2017-07-13 VITALS — BP 122/72 | HR 79 | Temp 98.0°F | Resp 18 | Ht 69.0 in | Wt 185.0 lb

## 2017-07-13 DIAGNOSIS — Z0000 Encounter for general adult medical examination without abnormal findings: Secondary | ICD-10-CM

## 2017-07-13 DIAGNOSIS — E559 Vitamin D deficiency, unspecified: Secondary | ICD-10-CM | POA: Diagnosis not present

## 2017-07-13 DIAGNOSIS — J302 Other seasonal allergic rhinitis: Secondary | ICD-10-CM | POA: Diagnosis not present

## 2017-07-13 DIAGNOSIS — M5416 Radiculopathy, lumbar region: Secondary | ICD-10-CM

## 2017-07-13 DIAGNOSIS — Z8546 Personal history of malignant neoplasm of prostate: Secondary | ICD-10-CM | POA: Diagnosis not present

## 2017-07-13 DIAGNOSIS — Z23 Encounter for immunization: Secondary | ICD-10-CM

## 2017-07-13 DIAGNOSIS — Z131 Encounter for screening for diabetes mellitus: Secondary | ICD-10-CM

## 2017-07-13 DIAGNOSIS — E785 Hyperlipidemia, unspecified: Secondary | ICD-10-CM

## 2017-07-13 DIAGNOSIS — J3089 Other allergic rhinitis: Secondary | ICD-10-CM | POA: Diagnosis not present

## 2017-07-13 MED ORDER — ATORVASTATIN CALCIUM 40 MG PO TABS: 40.0000 mg | ORAL_TABLET | Freq: Every day | ORAL | 3 refills | Status: DC

## 2017-07-13 MED ORDER — FLUTICASONE PROPIONATE 50 MCG/ACT NA SUSP: 2.0000 | Freq: Every day | NASAL | 2 refills | Status: DC

## 2017-07-13 NOTE — Patient Instructions (Addendum)
Preventive Care 40-64 Years, Male Preventive care refers to lifestyle choices and visits with your health care provider that can promote health and wellness. What does preventive care include? A yearly physical exam. This is also called an annual well check. Dental exams once or twice a year. Routine eye exams. Ask your health care provider how often you should have your eyes checked. Personal lifestyle choices, including: Daily care of your teeth and gums. Regular physical activity. Eating a healthy diet. Avoiding tobacco and drug use. Limiting alcohol use. Practicing safe sex. Taking low-dose aspirin every day starting at age 33. What happens during an annual well check? The services and screenings done by your health care provider during your annual well check will depend on your age, overall health, lifestyle risk factors, and family history of disease. Counseling Your health care provider may ask you questions about your: Alcohol use. Tobacco use. Drug use. Emotional well-being. Home and relationship well-being. Sexual activity. Eating habits. Work and work Statistician.  Screening You may have the following tests or measurements: Height, weight, and BMI. Blood pressure. Lipid and cholesterol levels. These may be checked every 5 years, or more frequently if you are over 52 years old. Skin check. Lung cancer screening. You may have this screening every year starting at age 5 if you have a 30-pack-year history of smoking and currently smoke or have quit within the past 15 years. Fecal occult blood test (FOBT) of the stool. You may have this test every year starting at age 82. Flexible sigmoidoscopy or colonoscopy. You may have a sigmoidoscopy every 5 years or a colonoscopy every 10 years starting at age 62. Prostate cancer screening. Recommendations will vary depending on your family history and other risks. Hepatitis C blood test. Hepatitis B blood test. Sexually  transmitted disease (STD) testing. Diabetes screening. This is done by checking your blood sugar (glucose) after you have not eaten for a while (fasting). You may have this done every 1-3 years.  Discuss your test results, treatment options, and if necessary, the need for more tests with your health care provider. Vaccines Your health care provider may recommend certain vaccines, such as: Influenza vaccine. This is recommended every year. Tetanus, diphtheria, and acellular pertussis (Tdap, Td) vaccine. You may need a Td booster every 10 years. Varicella vaccine. You may need this if you have not been vaccinated. Zoster vaccine. You may need this after age 32. Measles, mumps, and rubella (MMR) vaccine. You may need at least one dose of MMR if you were born in 1957 or later. You may also need a second dose. Pneumococcal 13-valent conjugate (PCV13) vaccine. You may need this if you have certain conditions and have not been vaccinated. Pneumococcal polysaccharide (PPSV23) vaccine. You may need one or two doses if you smoke cigarettes or if you have certain conditions. Meningococcal vaccine. You may need this if you have certain conditions. Hepatitis A vaccine. You may need this if you have certain conditions or if you travel or work in places where you may be exposed to hepatitis A. Hepatitis B vaccine. You may need this if you have certain conditions or if you travel or work in places where you may be exposed to hepatitis B. Haemophilus influenzae type b (Hib) vaccine. You may need this if you have certain risk factors.  Talk to your health care provider about which screenings and vaccines you need and how often you need them. This information is not intended to replace advice given to you by  your health care provider. Make sure you discuss any questions you have with your health care provider. Document Released: 10/01/2015 Document Revised: 05/24/2016 Document Reviewed: 07/06/2015 Elsevier  Interactive Patient Education  2017 [employer]. Back Exercises The following exercises strengthen the muscles that help to support the back. They also help to keep the lower back flexible. Doing these exercises can help to prevent back pain or lessen existing pain. If you have back pain or discomfort, try doing these exercises 2-3 times each day or as told by your health care provider. When the pain goes away, do them once each day, but increase the number of times that you repeat the steps for each exercise (do more repetitions). If you do not have back pain or discomfort, do these exercises once each day or as told by your health care provider. Exercises Single Knee to Chest  Repeat these steps 3-5 times for each leg: Lie on your back on a firm bed or the floor with your legs extended. Bring one knee to your chest. Your other leg should stay extended and in contact with the floor. Hold your knee in place by grabbing your knee or thigh. Pull on your knee until you feel a gentle stretch in your lower back. Hold the stretch for 10-30 seconds. Slowly release and straighten your leg.  Pelvic Tilt  Repeat these steps 5-10 times: Lie on your back on a firm bed or the floor with your legs extended. Bend your knees so they are pointing toward the ceiling and your feet are flat on the floor. Tighten your lower abdominal muscles to press your lower back against the floor. This motion will tilt your pelvis so your tailbone points up toward the ceiling instead of pointing to your feet or the floor. With gentle tension and even breathing, hold this position for 5-10 seconds.  Cat-Cow  Repeat these steps until your lower back becomes more flexible: Get into a hands-and-knees position on a firm surface. Keep your hands under your shoulders, and keep your knees under your hips. You may place padding under your knees for comfort. Let your head hang down, and point your tailbone toward the floor so your  lower back becomes rounded like the back of a cat. Hold this position for 5 seconds. Slowly lift your head and point your tailbone up toward the ceiling so your back forms a sagging arch like the back of a cow. Hold this position for 5 seconds.  Press-Ups  Repeat these steps 5-10 times: Lie on your abdomen (face-down) on the floor. Place your palms near your head, about shoulder-width apart. While you keep your back as relaxed as possible and keep your hips on the floor, slowly straighten your arms to raise the top half of your body and lift your shoulders. Do not use your back muscles to raise your upper torso. You may adjust the placement of your hands to make yourself more comfortable. Hold this position for 5 seconds while you keep your back relaxed. Slowly return to lying flat on the floor.  Bridges  Repeat these steps 10 times: Lie on your back on a firm surface. Bend your knees so they are pointing toward the ceiling and your feet are flat on the floor. Tighten your buttocks muscles and lift your buttocks off of the floor until your waist is at almost the same height as your knees. You should feel the muscles working in your buttocks and the back of your thighs. If you  do not feel these muscles, slide your feet 1-2 inches farther away from your buttocks. Hold this position for 3-5 seconds. Slowly lower your hips to the starting position, and allow your buttocks muscles to relax completely.  If this exercise is too easy, try doing it with your arms crossed over your chest. Abdominal Crunches  Repeat these steps 5-10 times: Lie on your back on a firm bed or the floor with your legs extended. Bend your knees so they are pointing toward the ceiling and your feet are flat on the floor. Cross your arms over your chest. Tip your chin slightly toward your chest without bending your neck. Tighten your abdominal muscles and slowly raise your trunk (torso) high enough to lift your shoulder  blades a tiny bit off of the floor. Avoid raising your torso higher than that, because it can put too much stress on your low back and it does not help to strengthen your abdominal muscles. Slowly return to your starting position.  Back Lifts Repeat these steps 5-10 times: Lie on your abdomen (face-down) with your arms at your sides, and rest your forehead on the floor. Tighten the muscles in your legs and your buttocks. Slowly lift your chest off of the floor while you keep your hips pressed to the floor. Keep the back of your head in line with the curve in your back. Your eyes should be looking at the floor. Hold this position for 3-5 seconds. Slowly return to your starting position.  Contact a health care provider if: Your back pain or discomfort gets much worse when you do an exercise. Your back pain or discomfort does not lessen within 2 hours after you exercise. If you have any of these problems, stop doing these exercises right away. Do not do them again unless your health care provider says that you can. Get help right away if: You develop sudden, severe back pain. If this happens, stop doing the exercises right away. Do not do them again unless your health care provider says that you can. This information is not intended to replace advice given to you by your health care provider. Make sure you discuss any questions you have with your health care provider. Document Released: 10/12/2004 Document Revised: 01/12/2016 Document Reviewed: 10/29/2014 Elsevier Interactive Patient Education  2017 [employer] Sciatica Sciatica is pain, numbness, weakness, or tingling along the path of the sciatic nerve. The sciatic nerve starts in the lower back and runs down the back of each leg. The nerve controls the muscles in the lower leg and in the back of the knee. It also provides feeling (sensation) to the back of the thigh, the lower leg, and the sole of the foot. Sciatica is a symptom of another  medical condition that pinches or puts pressure on the sciatic nerve. Generally, sciatica only affects one side of the body. Sciatica usually goes away on its own or with treatment. In some cases, sciatica may keep coming back (recur). What are the causes? This condition is caused by pressure on the sciatic nerve, or pinching of the sciatic nerve. This may be the result of:  A disk in between the bones of the spine (vertebrae) bulging out too far (herniated disk).  Age-related changes in the spinal disks (degenerative disk disease).  A pain disorder that affects a muscle in the buttock (piriformis syndrome).  Extra bone growth (bone spur) near the sciatic nerve.  An injury or break (fracture) of the pelvis.  Pregnancy.  Tumor (  rare).  What increases the risk? The following factors may make you more likely to develop this condition:  Playing sports that place pressure or stress on the spine, such as football or weight lifting.  Having poor strength and flexibility.  A history of back injury.  A history of back surgery.  Sitting for Lemme periods of time.  Doing activities that involve repetitive bending or lifting.  Obesity.  What are the signs or symptoms? Symptoms can vary from mild to very severe, and they may include:  Any of these problems in the lower back, leg, hip, or buttock: ? Mild tingling or dull aches. ? Burning sensations. ? Sharp pains.  Numbness in the back of the calf or the sole of the foot.  Leg weakness.  Severe back pain that makes movement difficult.  These symptoms may get worse when you cough, sneeze, or laugh, or when you sit or stand for [place] periods of time. Being overweight may also make symptoms worse. In some cases, symptoms may recur over time. How is this diagnosed? This condition may be diagnosed based on:  Your symptoms.  A physical exam. Your health care provider may ask you to do certain movements to check whether those  movements trigger your symptoms.  You may have tests, including: ? Blood tests. ? X-rays. ? MRI. ? CT scan.  How is this treated? In many cases, this condition improves on its own, without any treatment. However, treatment may include:  Reducing or modifying physical activity during periods of pain.  Exercising and stretching to strengthen your abdomen and improve the flexibility of your spine.  Icing and applying heat to the affected area.  Medicines that help: ? To relieve pain and swelling. ? To relax your muscles.  Injections of medicines that help to relieve pain, irritation, and inflammation around the sciatic nerve (steroids).  Surgery.  Follow these instructions at home: Medicines  Take over-the-counter and prescription medicines only as told by your health care provider.  Do not drive or operate heavy machinery while taking prescription pain medicine. Managing pain  If directed, apply ice to the affected area. ? Put ice in a plastic bag. ? Place a towel between your skin and the bag. ? Leave the ice on for 20 minutes, 2-3 times a day.  After icing, apply heat to the affected area before you exercise or as often as told by your health care provider. Use the heat source that your health care provider recommends, such as a moist heat pack or a heating pad. ? Place a towel between your skin and the heat source. ? Leave the heat on for 20-30 minutes. ? Remove the heat if your skin turns bright red. This is especially important if you are unable to feel pain, heat, or cold. You may have a greater risk of getting burned. Activity  Return to your normal activities as told by your health care provider. Ask your health care provider what activities are safe for you. ? Avoid activities that make your symptoms worse.  Take brief periods of rest throughout the day. Resting in a lying or standing position is usually better than sitting to rest. ? When you rest for longer  periods, mix in some mild activity or stretching between periods of rest. This will help to prevent stiffness and pain. ? Avoid sitting for Genest periods of time without moving. Get up and move around at least one time each hour.  Exercise and stretch regularly, as told  by your health care provider.  Do not lift anything that is heavier than 10 lb (4.5 kg) while you have symptoms of sciatica. When you do not have symptoms, you should still avoid heavy lifting, especially repetitive heavy lifting.  When you lift objects, always use proper lifting technique, which includes: ? Bending your knees. ? Keeping the load close to your body. ? Avoiding twisting. General instructions  Use good posture. ? Avoid leaning forward while sitting. ? Avoid hunching over while standing.  Maintain a healthy weight. Excess weight puts extra stress on your back and makes it difficult to maintain good posture.  Wear supportive, comfortable shoes. Avoid wearing high heels.  Avoid sleeping on a mattress that is too soft or too hard. A mattress that is firm enough to support your back when you sleep may help to reduce your pain.  Keep all follow-up visits as told by your health care provider. This is important. Contact a health care provider if:  You have pain that wakes you up when you are sleeping.  You have pain that gets worse when you lie down.  Your pain is worse than you have experienced in the past.  Your pain lasts longer than 4 weeks.  You experience unexplained weight loss. Get help right away if:  You lose control of your bowel or bladder (incontinence).  You have: ? Weakness in your lower back, pelvis, buttocks, or legs that gets worse. ? Redness or swelling of your back. ? A burning sensation when you urinate. This information is not intended to replace advice given to you by your health care provider. Make sure you discuss any questions you have with your health care provider. Document  Released: 08/29/2001 Document Revised: 02/08/2016 Document Reviewed: 05/14/2015 Elsevier Interactive Patient Education  2017 [employer].

## 2017-07-13 NOTE — Progress Notes (Signed)
Name: Jeffrey Harding   MRN: [id number]    DOB: 1961/07/25   Date:07/13/2017       Progress Note  Subjective  Chief Complaint  Chief Complaint  Patient presents with  . Annual Exam  . Hyperlipidemia    HPI  Male exam: he states he feels well, same relationship for the past 4 years, sexually active, his penile prosthesis works well, sees Urologist yearly, last PSA was 0.01 done 02/2017. No penile discharge, not interested in STI screen.   Hyperlipidemia: taking atorvastatin and aspirin daily, no chest pain, palpitation, SOB with active. He is very active, going to gym 2-4 times a week  Perennial Allergic Rhinitis: he has noticed worsening of sneezing, nasal congestion and facial pressure, he would like a refill of Flonase. He states it works well for him  Lumbar Radiculitis: he has noticed over the past couple of months a pain/numbness that goes up from lateral left foot, to the outside of left hip, usually happens in the am's when first stands up, no symptoms during the day, no bowel or bladder incontinence, no saddle anesthesia, no weakness.    Patient Active Problem List   Diagnosis Date Noted  . Perennial allergic rhinitis with seasonal variation 06/18/2015  . H/O malignant neoplasm of prostate 06/18/2015  . History of prostatectomy 06/18/2015  . Vitamin D deficiency 06/18/2015  . Dyslipidemia 06/18/2015  . 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 CYST EXCISION  2009   back  . LASER OF PROSTATE W/ GREEN LIGHT PVP  2008  . PENILE PROSTHESIS IMPLANT    . PROSTATECTOMY      Family History  Problem Relation Age of Onset  . Hypertension Mother   . Hyperlipidemia Mother   . Heart disease Mother   . Diabetes Mother   . Heart disease Father   . Hyperlipidemia Father   . Hypertension Father   . Cancer Father        prostate  . Hypertension Sister   . Hyperlipidemia Sister     Social History    Social History  . Marital status: Divorced    Spouse name: N/A  . Number of children: N/A  . Years of education: N/A   Occupational History  . Not on file.   Social History Main Topics  . Smoking status: Never Smoker  . Smokeless tobacco: Never Used  . Alcohol use 0.0 oz/week     Comment: rarely  . Drug use: No  . Sexual activity: Yes    Partners: Female   Other Topics Concern  . Not on file   Social History Narrative   Lives alone, works as a self employed Administrator, dating for the past 4 years   She has a grown daughter.      Current Outpatient Prescriptions:  .  aspirin EC 81 MG tablet, Take 1 tablet (81 mg total) by mouth daily., Disp: 30 tablet, Rfl: 0 .  atorvastatin (LIPITOR) 40 MG tablet, Take 1 tablet (40 mg total) by mouth daily., Disp: 90 tablet, Rfl: 3 .  fluticasone (FLONASE) 50 MCG/ACT nasal spray, Place 2 sprays into both nostrils daily., Disp: 16 g, Rfl: 2  No Known Allergies   ROS  Constitutional: Negative for fever or weight change.  Respiratory: Negative for cough and shortness of breath.   Cardiovascular: Negative for chest pain or palpitations.  Gastrointestinal: Negative for abdominal pain, no bowel changes.  Musculoskeletal: Negative  for gait problem or joint swelling.  Skin: Negative for rash.  Neurological: Negative for dizziness or headache.  No other specific complaints in a complete review of systems (except as listed in HPI above).  Objective  Vitals:   07/13/17 0835  BP: 122/72  Pulse: 79  Resp: 18  Temp: 98 F (36.7 C)  TempSrc: Oral  SpO2: 97%  Weight: 185 lb (83.9 kg)  Height: 5\' 9"  (1.753 m)    Body mass index is 27.32 kg/m.  Physical Exam  Constitutional: Patient appears well-developed and well-nourished. No distress.  HENT: Head: Normocephalic and atraumatic. Ears: B TMs ok, no erythema or effusion; Nose: Nose normal. Mouth/Throat: Oropharynx is clear and moist. No oropharyngeal exudate.  Eyes: Conjunctivae  and EOM are normal. Pupils are equal, round, and reactive to light. No scleral icterus.  Neck: Normal range of motion. Neck supple. No JVD present. No thyromegaly present.  Cardiovascular: Normal rate, regular rhythm and normal heart sounds.  No murmur heard. No BLE edema. Pulmonary/Chest: Effort normal and breath sounds normal. No respiratory distress. Abdominal: Soft. Bowel sounds are normal, no distension. There is no tenderness. no masses MALE GENITALIA: not done RECTAL: not done Musculoskeletal: Normal range of motion, no joint effusions. No gross deformities Neurological: he is alert and oriented to person, place, and time. No cranial nerve deficit. Coordination, balance, strength, speech and gait are normal.  Skin: Skin is warm and dry. No rash noted. No erythema.  Psychiatric: Patient has a normal mood and affect. behavior is normal. Judgment and thought content normal.   PHQ2/9: Depression screen [facility] 2/9 07/13/2017 06/18/2015  Decreased Interest 0 0  Down, Depressed, Hopeless 0 0  PHQ - 2 Score 0 0     Fall Risk: Fall Risk  07/13/2017 06/18/2015  Falls in the past year? No No     Functional Status Survey: Is the patient deaf or have difficulty hearing?: No Does the patient have difficulty seeing, even when wearing glasses/contacts?: No Does the patient have difficulty concentrating, remembering, or making decisions?: No Does the patient have difficulty walking or climbing stairs?: No Does the patient have difficulty dressing or bathing?: No Does the patient have difficulty doing errands alone such as visiting a doctor's office or shopping?: No    Assessment & Plan  1. Physical exam, annual  Discussed importance of 150 minutes of physical activity weekly, eat two servings of fish weekly, eat one serving of tree nuts ( cashews, pistachios, pecans, almonds.Marland Kitchen) every other day, eat 6 servings of fruit/vegetables daily and drink plenty of water and avoid sweet beverages.   Colonoscopy is up to date  - COMPLETE METABOLIC PANEL WITH GFR - CBC with Differential/Platelet  AUA: done by urologist   2. Need for immunization against influenza  refused  3. Lumbar radiculitis  We will start with home exercise  4. H/O malignant neoplasm of prostate  Sees Urologist   5. Dyslipidemia  - atorvastatin (LIPITOR) 40 MG tablet; Take 1 tablet (40 mg total) by mouth daily.  Dispense: 90 tablet; Refill: 3 - Lipid panel  6. Vitamin D deficiency  - VITAMIN D 25 Hydroxy (Vit-D Deficiency, Fractures)  7. Diabetes mellitus screening  - Hemoglobin A1c - Insulin, random  8. Perennial allergic rhinitis with seasonal variation  - fluticasone (FLONASE) 50 MCG/ACT nasal spray; Place 2 sprays into both nostrils daily.  Dispense: 16 g; Refill: 2

## 2017-07-17 LAB — CBC WITH DIFFERENTIAL/PLATELET
BASOS PCT: 0.5 %
Basophils Absolute: 22 cells/uL (ref 0–200)
EOS ABS: 60 {cells}/uL (ref 15–500)
Eosinophils Relative: 1.4 %
HCT: 43.3 % (ref 38.5–50.0)
HEMOGLOBIN: 14.7 g/dL (ref 13.2–17.1)
LYMPHS ABS: 1565 {cells}/uL (ref 850–3900)
MCH: 30.6 pg (ref 27.0–33.0)
MCHC: 33.9 g/dL (ref 32.0–36.0)
MCV: 90 fL (ref 80.0–100.0)
MPV: 10.9 fL (ref 7.5–12.5)
Monocytes Relative: 7.7 %
NEUTROS ABS: 2322 {cells}/uL (ref 1500–7800)
Neutrophils Relative %: 54 %
Platelets: 200 10*3/uL (ref 140–400)
RBC: 4.81 10*6/uL (ref 4.20–5.80)
RDW: 12.8 % (ref 11.0–15.0)
Total Lymphocyte: 36.4 %
WBC: 4.3 10*3/uL (ref 3.8–10.8)
WBCMIX: 331 {cells}/uL (ref 200–950)

## 2017-07-17 LAB — LIPID PANEL
CHOL/HDL RATIO: 2.8 (calc) (ref <5.0)
Cholesterol: 129 mg/dL (ref <200)
HDL: 46 mg/dL (ref >40)
LDL CHOLESTEROL (CALC): 69 mg/dL
Non-HDL Cholesterol (Calc): 83 mg/dL (calc) (ref <130)
Triglycerides: 56 mg/dL (ref <150)

## 2017-07-17 LAB — COMPLETE METABOLIC PANEL WITH GFR
AG Ratio: 1.5 (calc) (ref 1.0–2.5)
ALT: 42 U/L (ref 9–46)
AST: 24 U/L (ref 10–35)
Albumin: 4.3 g/dL (ref 3.6–5.1)
Alkaline phosphatase (APISO): 74 U/L (ref 40–115)
BUN: 14 mg/dL (ref 7–25)
CALCIUM: 8.9 mg/dL (ref 8.6–10.3)
CO2: 28 mmol/L (ref 20–32)
CREATININE: 1 mg/dL (ref 0.70–1.33)
Chloride: 104 mmol/L (ref 98–110)
GFR, EST NON AFRICAN AMERICAN: 83 mL/min/{1.73_m2} (ref >=60)
GFR, Est African American: 96 mL/min/{1.73_m2} (ref >=60)
GLOBULIN: 2.8 g/dL (ref 1.9–3.7)
Glucose, Bld: 104 mg/dL — ABNORMAL HIGH (ref 65–99)
Potassium: 4 mmol/L (ref 3.5–5.3)
Sodium: 139 mmol/L (ref 135–146)
Total Bilirubin: 0.8 mg/dL (ref 0.2–1.2)
Total Protein: 7.1 g/dL (ref 6.1–8.1)

## 2017-07-17 LAB — HEMOGLOBIN A1C
EAG (MMOL/L): 6.2 (calc)
Hgb A1c MFr Bld: 5.5 % of total Hgb (ref <5.7)
MEAN PLASMA GLUCOSE: 111 (calc)

## 2017-07-17 LAB — VITAMIN D 25 HYDROXY (VIT D DEFICIENCY, FRACTURES): Vit D, 25-Hydroxy: 43 ng/mL (ref 30–100)

## 2017-07-17 LAB — INSULIN, RANDOM: Insulin: 2 u[IU]/mL (ref 2.0–19.6)

## 2017-10-10 ENCOUNTER — Other Ambulatory Visit: Payer: Self-pay | Admitting: Family Medicine

## 2017-10-10 DIAGNOSIS — J302 Other seasonal allergic rhinitis: Secondary | ICD-10-CM

## 2017-10-10 DIAGNOSIS — J3089 Other allergic rhinitis: Principal | ICD-10-CM

## 2017-10-10 MED ORDER — FLUTICASONE PROPIONATE 50 MCG/ACT NA SUSP: 2.0000 | Freq: Every day | NASAL | 2 refills | Status: DC

## 2017-10-10 NOTE — Telephone Encounter (Signed)
Copied from [place] [id number]. Topic: Quick Communication - Rx Refill/Question >> Oct 10, 2017  9:51 AM Arletha Grippe wrote: Medication: fluticasone (FLONASE) 50 MCG/ACT nasal spray   Has the patient contacted their pharmacy? Yes.   Pharmacy sent a fax, have not heard back    (Agent: If no, request that the patient contact the pharmacy for the refill.)   Preferred Pharmacy (with phone number or street name): [facility] [address]    Agent: Please be advised that RX refills may take up to 3 business days. We ask that you follow-up with your pharmacy.

## 2017-12-31 ENCOUNTER — Other Ambulatory Visit: Payer: Self-pay | Admitting: Family Medicine

## 2017-12-31 DIAGNOSIS — J302 Other seasonal allergic rhinitis: Secondary | ICD-10-CM

## 2017-12-31 DIAGNOSIS — J3089 Other allergic rhinitis: Principal | ICD-10-CM

## 2017-12-31 NOTE — Telephone Encounter (Signed)
Refill request for general medication. Flonase to [facility]  Last office visit: 07/13/2017  Follow up on 07/19/2018

## 2018-04-26 DIAGNOSIS — N529 Male erectile dysfunction, unspecified: Secondary | ICD-10-CM | POA: Diagnosis not present

## 2018-04-26 DIAGNOSIS — C61 Malignant neoplasm of prostate: Secondary | ICD-10-CM | POA: Diagnosis not present

## 2018-04-26 DIAGNOSIS — Z8546 Personal history of malignant neoplasm of prostate: Secondary | ICD-10-CM | POA: Diagnosis not present

## 2018-07-19 ENCOUNTER — Encounter: Payer: Self-pay | Admitting: Family Medicine

## 2018-07-19 ENCOUNTER — Ambulatory Visit (INDEPENDENT_AMBULATORY_CARE_PROVIDER_SITE_OTHER): Payer: BLUE CROSS/BLUE SHIELD | Admitting: Family Medicine

## 2018-07-19 VITALS — BP 118/70 | HR 80 | Temp 98.2°F | Ht 69.0 in | Wt 184.9 lb

## 2018-07-19 DIAGNOSIS — Z131 Encounter for screening for diabetes mellitus: Secondary | ICD-10-CM

## 2018-07-19 DIAGNOSIS — J3089 Other allergic rhinitis: Secondary | ICD-10-CM | POA: Diagnosis not present

## 2018-07-19 DIAGNOSIS — Z8546 Personal history of malignant neoplasm of prostate: Secondary | ICD-10-CM | POA: Diagnosis not present

## 2018-07-19 DIAGNOSIS — F4321 Adjustment disorder with depressed mood: Secondary | ICD-10-CM

## 2018-07-19 DIAGNOSIS — Z0000 Encounter for general adult medical examination without abnormal findings: Secondary | ICD-10-CM | POA: Diagnosis not present

## 2018-07-19 DIAGNOSIS — E785 Hyperlipidemia, unspecified: Secondary | ICD-10-CM | POA: Diagnosis not present

## 2018-07-19 DIAGNOSIS — E559 Vitamin D deficiency, unspecified: Secondary | ICD-10-CM | POA: Diagnosis not present

## 2018-07-19 DIAGNOSIS — J302 Other seasonal allergic rhinitis: Secondary | ICD-10-CM

## 2018-07-19 DIAGNOSIS — Z79899 Other long term (current) drug therapy: Secondary | ICD-10-CM

## 2018-07-19 DIAGNOSIS — Z23 Encounter for immunization: Secondary | ICD-10-CM

## 2018-07-19 MED ORDER — ATORVASTATIN CALCIUM 40 MG PO TABS: 40.0000 mg | ORAL_TABLET | Freq: Every day | ORAL | 3 refills | Status: DC

## 2018-07-19 MED ORDER — VITAMIN C 100 MG PO TABS: 100.0000 mg | ORAL_TABLET | Freq: Every day | ORAL | 0 refills | Status: DC

## 2018-07-19 NOTE — Progress Notes (Signed)
Name: Jeffrey Harding   MRN: [id number]    DOB: 24-Jul-1961   Date:07/19/2018       Progress Note  Subjective  Chief Complaint  Chief Complaint  Patient presents with  . Annual Exam    HPI  Patient presents for annual CPE and follow up.  Hyperlipidemia: taking atorvastatin and aspirin daily, no chest pain, palpitation, SOB with active. He is very active, going to gym 2-4 times a week, he denies side effects of medication   Intermittently low back: had a flare a few weeks ago but doing well now  Perennial Allergic Rhinitis: he has been doing well on flonase, he has recurrent nasal congestion, no sneezing at this time   Vitamin D defi: on otc supplementation   Situation depression: he is has been very worried about his father, having worsening of dementia, with paranoia, had multiple trips to [facility]. He states no change in sleep pattern. He feels sad at times, does not want medications He states his siblings are very close and they are helping each other out.    Diet: avoiding salt, eats fruit and vegetables most days but he eats fast food  Exercise: goes to the gym 2-4 times weekly    Depression:  Depression screen [facility] 2/9 07/19/2018 07/13/2017 06/18/2015  Decreased Interest 0 0 0  Down, Depressed, Hopeless 2 0 0  PHQ - 2 Score 2 0 0  Altered sleeping 1 - -  Tired, decreased energy 0 - -  Change in appetite 0 - -  Feeling bad or failure about yourself  0 - -  Trouble concentrating 0 - -  Moving slowly or fidgety/restless 0 - -  Suicidal thoughts 0 - -  PHQ-9 Score 3 - -  Difficult doing work/chores Not difficult at all - -    Hypertension:  BP Readings from Last 3 Encounters:  07/19/18 118/70  07/13/17 122/72  07/07/16 110/72    Obesity: Wt Readings from Last 3 Encounters:  07/19/18 184 lb 14.4 oz (83.9 kg)  07/13/17 185 lb (83.9 kg)  07/07/16 177 lb 12.8 oz (80.6 kg)   BMI Readings from Last 3 Encounters:  07/19/18 27.30 kg/m  07/13/17 27.32 kg/m  07/07/16  26.26 kg/m     Lipids:  Lab Results  Component Value Date   CHOL 129 07/13/2017   CHOL 111 (L) 07/07/2016   CHOL 92 (L) 06/18/2015   Lab Results  Component Value Date   HDL 46 07/13/2017   HDL 47 07/07/2016   HDL 32 (L) 06/18/2015   Lab Results  Component Value Date   LDLCALC 69 07/13/2017   LDLCALC 54 07/07/2016   LDLCALC 49 06/18/2015   Lab Results  Component Value Date   TRIG 56 07/13/2017   TRIG 52 07/07/2016   TRIG 56 06/18/2015   Lab Results  Component Value Date   CHOLHDL 2.8 07/13/2017   CHOLHDL 2.4 07/07/2016   CHOLHDL 2.9 06/18/2015   No results found for: LDLDIRECT Glucose:  Glucose, Bld  Date Value Ref Range Status  07/13/2017 104 (H) 65 - 99 mg/dL Final    Comment:    .            Fasting reference interval . For someone without known diabetes, a glucose value between 100 and 125 mg/dL is consistent with prediabetes and should be confirmed with a follow-up test. .   07/07/2016 87 65 - 99 mg/dL Final  08/15/2015 110 (H) 65 - 99 mg/dL Final  Office Visit from 07/19/2018 in [facility]  AUDIT-C Score  1      Single STD testing and prevention (HIV/chl/gon/syphilis): refused Hep C: done in 2015  Skin cancer: discussed atypical lesions  Colorectal cancer: repeat 2022 Prostate cancer: had it at [facility] in June   IPSS Questionnaire (AUA-7): Over the past month.   1)  How often have you had a sensation of not emptying your bladder completely after you finish urinating?  0 - Not at all  2)  How often have you had to urinate again less than two hours after you finished urinating? 0 - Not at all  3)  How often have you found you stopped and started again several times when you urinated?  0 - Not at all  4) How difficult have you found it to postpone urination?  0 - Not at all  5) How often have you had a weak urinary stream?  0 - Not at all  6) How often have you had to push or strain to begin urination?  0 - Not at all   7) How many times did you most typically get up to urinate from the time you went to bed until the time you got up in the morning?  1 - 1 time  Total score:  0-7 mildly symptomatic   8-19 moderately symptomatic   20-35 severely symptomatic    Lung cancer:  Low Dose CT Chest recommended if Age 87-80 years, 30 pack-year currently smoking OR have quit w/in 15years. Patient does not qualify.   AAA:  The USPSTF recommends one-time screening with ultrasonography in men ages 53 to 64 years who have ever smoked ECG:  2016  Advanced Care Planning: A voluntary discussion about advance care planning including the explanation and discussion of advance directives.  Discussed health care proxy and Living will, and the patient was able to identify a health care proxy as daughter - Courtnee.  Patient does not have a living will at present time. If patient does have living will, I have requested they bring this to the clinic to be scanned in to their chart.  Patient Active Problem List   Diagnosis Date Noted  . Perennial allergic rhinitis with seasonal variation 06/18/2015  . H/O malignant neoplasm of prostate 06/18/2015  . History of prostatectomy 06/18/2015  . Vitamin D deficiency 06/18/2015  . Dyslipidemia 06/18/2015  . 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 CYST EXCISION  2009   back  . LASER OF PROSTATE W/ GREEN LIGHT PVP  2008  . PENILE PROSTHESIS IMPLANT    . PROSTATECTOMY      Family History  Problem Relation Age of Onset  . Hypertension Mother   . Hyperlipidemia Mother   . Heart disease Mother   . Diabetes Mother   . Heart disease Father   . Hyperlipidemia Father   . Hypertension Father   . Cancer Father        prostate  . Hypertension Sister   . Hyperlipidemia Sister     Social History   Socioeconomic History  . Marital status: Single    Spouse name: Not on file  . Number of children: 1  . Years of  education: 41  . Highest education level: High school graduate  Occupational History  . Not on file  Social Needs  . Financial resource strain: Not hard at all  . Food insecurity:  Worry: Never true    Inability: Never true  . Transportation needs:    Medical: No    Non-medical: No  Tobacco Use  . Smoking status: Never Smoker  . Smokeless tobacco: Never Used  Substance and Sexual Activity  . Alcohol use: Yes    Alcohol/week: 0.0 standard drinks    Comment: rarely  . Drug use: No  . Sexual activity: Yes    Partners: Female  Lifestyle  . Physical activity:    Days per week: 4 days    Minutes per session: 50 min  . Stress: To some extent  Relationships  . Social connections:    Talks on phone: Three times a week    Gets together: Three times a week    Attends religious service: More than 4 times per year    Active member of club or organization: No    Attends meetings of clubs or organizations: Never    Relationship status: Divorced  . Intimate partner violence:    Fear of current or ex partner: No    Emotionally abused: No    Physically abused: No    Forced sexual activity: No  Other Topics Concern  . Not on file  Social History Narrative   Lives alone, works as a self employed Administrator, dating for the past 4 years   She has a grown daughter.      Current Outpatient Medications:  .  aspirin EC 81 MG tablet, Take 1 tablet (81 mg total) by mouth daily., Disp: 30 tablet, Rfl: 0 .  atorvastatin (LIPITOR) 40 MG tablet, Take 1 tablet (40 mg total) by mouth daily., Disp: 90 tablet, Rfl: 3 .  cholecalciferol (VITAMIN D) 1000 units tablet, Take 1,000 Units by mouth daily., Disp: , Rfl:  .  fluticasone (FLONASE) 50 MCG/ACT nasal spray, SPRAY 2 SPRAYS INTO EACH NOSTRIL EVERY DAY, Disp: 16 g, Rfl: 0 .  Ascorbic Acid (VITAMIN C) 100 MG tablet, Take 1 tablet (100 mg total) by mouth daily., Disp: 30 tablet, Rfl: 0  No Known Allergies   ROS  Constitutional: Negative  for fever or weight change.  Respiratory: Negative for cough and shortness of breath.   Cardiovascular: Negative for chest pain or palpitations.  Gastrointestinal: Negative for abdominal pain, no bowel changes.  Musculoskeletal: Negative for gait problem or joint swelling.  Skin: Negative for rash.  Neurological: Negative for dizziness or headache.  No other specific complaints in a complete review of systems (except as listed in HPI above).  Objective  Vitals:   07/19/18 0821  BP: 118/70  Pulse: 80  Temp: 98.2 F (36.8 C)  SpO2: 99%  Weight: 184 lb 14.4 oz (83.9 kg)  Height: 5\' 9"  (1.753 m)    Body mass index is 27.3 kg/m.  Physical Exam  Constitutional: Patient appears well-developed and well-nourished. No distress.  HENT: Head: Normocephalic and atraumatic. Ears: B TMs ok, no erythema or effusion; Nose: Nose normal. Mouth/Throat: Oropharynx is clear and moist. No oropharyngeal exudate.  Eyes: Conjunctivae and EOM are normal. Pupils are equal, round, and reactive to light. No scleral icterus.  Neck: Normal range of motion. Neck supple. No JVD present. No thyromegaly present.  Cardiovascular: Normal rate, regular rhythm and normal heart sounds.  No murmur heard. No BLE edema. Pulmonary/Chest: Effort normal and breath sounds normal. No respiratory distress. Abdominal: Soft. Bowel sounds are normal, no distension. There is no tenderness. no masses MALE GENITALIA: sees urologist  RECTAL: not done, sees urologist  Musculoskeletal:  Normal range of motion, no joint effusions. No gross deformities Neurological: he is alert and oriented to person, place, and time. No cranial nerve deficit. Coordination, balance, strength, speech and gait are normal.  Skin: Skin is warm and dry. No rash noted. No erythema.  Psychiatric: Patient has a normal mood and affect. behavior is normal. Judgment and thought content normal.   PHQ2/9: Depression screen [facility] 2/9 07/19/2018 07/13/2017 06/18/2015   Decreased Interest 0 0 0  Down, Depressed, Hopeless 2 0 0  PHQ - 2 Score 2 0 0  Altered sleeping 1 - -  Tired, decreased energy 0 - -  Change in appetite 0 - -  Feeling bad or failure about yourself  0 - -  Trouble concentrating 0 - -  Moving slowly or fidgety/restless 0 - -  Suicidal thoughts 0 - -  PHQ-9 Score 3 - -  Difficult doing work/chores Not difficult at all - -     Fall Risk: Fall Risk  07/19/2018 07/13/2017 06/18/2015  Falls in the past year? 0 No No    Functional Status Survey: Is the patient deaf or have difficulty hearing?: No Does the patient have difficulty seeing, even when wearing glasses/contacts?: No Does the patient have difficulty concentrating, remembering, or making decisions?: No Does the patient have difficulty walking or climbing stairs?: No Does the patient have difficulty dressing or bathing?: No Does the patient have difficulty doing errands alone such as visiting a doctor's office or shopping?: No   Assessment & Plan  1. H/O malignant neoplasm of prostate  - Ambulatory referral to Urology - he wants to see someone locally, insurance dose not cover [place] provider   2. Physical exam, annual  Refuses flu vaccine or STI screen  3. Vitamin D deficiency  - VITAMIN D 25 Hydroxy (Vit-D Deficiency, Fractures)  4. Dyslipidemia  - Lipid panel - COMPLETE METABOLIC PANEL WITH GFR - atorvastatin refill sent to pharmacy   5. Diabetes mellitus screening  - Hemoglobin A1c  6. Perennial allergic rhinitis with seasonal variation   7. Strothers-term use of high-risk medication  - CBC with Differential/Platelet  8. Situational depression  Very worried about his father  - CBC with Differential/Platelet    -Prostate cancer screening and PSA options (with potential risks and benefits of testing vs not testing) were discussed along with recent recs/guidelines. -USPSTF grade A and B recommendations reviewed with patient; age-appropriate  recommendations, preventive care, screening tests, etc discussed and encouraged; healthy living encouraged; see AVS for patient education given to patient -Discussed importance of 150 minutes of physical activity weekly, eat two servings of fish weekly, eat one serving of tree nuts ( cashews, pistachios, pecans, almonds.Marland Kitchen) every other day, eat 6 servings of fruit/vegetables daily and drink plenty of water and avoid sweet beverages.

## 2018-07-19 NOTE — Patient Instructions (Signed)

## 2018-07-20 LAB — CBC WITH DIFFERENTIAL/PLATELET
Basophils Absolute: 20 cells/uL (ref 0–200)
Basophils Relative: 0.4 %
EOS PCT: 1.2 %
Eosinophils Absolute: 60 cells/uL (ref 15–500)
HEMATOCRIT: 42.1 % (ref 38.5–50.0)
HEMOGLOBIN: 14.2 g/dL (ref 13.2–17.1)
LYMPHS ABS: 1735 {cells}/uL (ref 850–3900)
MCH: 30.8 pg (ref 27.0–33.0)
MCHC: 33.7 g/dL (ref 32.0–36.0)
MCV: 91.3 fL (ref 80.0–100.0)
MPV: 10.9 fL (ref 7.5–12.5)
Monocytes Relative: 7.6 %
NEUTROS ABS: 2805 {cells}/uL (ref 1500–7800)
Neutrophils Relative %: 56.1 %
PLATELETS: 202 10*3/uL (ref 140–400)
RBC: 4.61 10*6/uL (ref 4.20–5.80)
RDW: 12.7 % (ref 11.0–15.0)
Total Lymphocyte: 34.7 %
WBC mixed population: 380 cells/uL (ref 200–950)
WBC: 5 10*3/uL (ref 3.8–10.8)

## 2018-07-20 LAB — COMPLETE METABOLIC PANEL WITH GFR
AG Ratio: 1.7 (calc) (ref 1.0–2.5)
ALKALINE PHOSPHATASE (APISO): 65 U/L (ref 40–115)
ALT: 44 U/L (ref 9–46)
AST: 33 U/L (ref 10–35)
Albumin: 4.1 g/dL (ref 3.6–5.1)
BILIRUBIN TOTAL: 0.8 mg/dL (ref 0.2–1.2)
BUN: 12 mg/dL (ref 7–25)
CHLORIDE: 104 mmol/L (ref 98–110)
CO2: 29 mmol/L (ref 20–32)
Calcium: 8.7 mg/dL (ref 8.6–10.3)
Creat: 0.97 mg/dL (ref 0.70–1.33)
GFR, Est African American: 99 mL/min/{1.73_m2} (ref >=60)
GFR, Est Non African American: 86 mL/min/{1.73_m2} (ref >=60)
Globulin: 2.4 g/dL (calc) (ref 1.9–3.7)
Glucose, Bld: 99 mg/dL (ref 65–99)
Potassium: 3.9 mmol/L (ref 3.5–5.3)
Sodium: 140 mmol/L (ref 135–146)
TOTAL PROTEIN: 6.5 g/dL (ref 6.1–8.1)

## 2018-07-20 LAB — HEMOGLOBIN A1C
Hgb A1c MFr Bld: 5.7 % of total Hgb — ABNORMAL HIGH (ref <5.7)
MEAN PLASMA GLUCOSE: 117 (calc)
eAG (mmol/L): 6.5 (calc)

## 2018-07-20 LAB — LIPID PANEL
CHOLESTEROL: 106 mg/dL (ref <200)
HDL: 38 mg/dL — AB (ref >40)
LDL CHOLESTEROL (CALC): 55 mg/dL
NON-HDL CHOLESTEROL (CALC): 68 mg/dL (ref <130)
TRIGLYCERIDES: 57 mg/dL (ref <150)
Total CHOL/HDL Ratio: 2.8 (calc) (ref <5.0)

## 2018-07-20 LAB — VITAMIN D 25 HYDROXY (VIT D DEFICIENCY, FRACTURES): Vit D, 25-Hydroxy: 43 ng/mL (ref 30–100)

## 2018-08-13 ENCOUNTER — Ambulatory Visit: Payer: BLUE CROSS/BLUE SHIELD | Admitting: Urology

## 2018-08-23 ENCOUNTER — Encounter: Payer: Self-pay | Admitting: Family Medicine

## 2018-08-23 ENCOUNTER — Ambulatory Visit (INDEPENDENT_AMBULATORY_CARE_PROVIDER_SITE_OTHER): Payer: BLUE CROSS/BLUE SHIELD | Admitting: Family Medicine

## 2018-08-23 VITALS — BP 118/86 | HR 93 | Temp 97.9°F | Resp 16 | Ht 69.0 in | Wt 172.0 lb

## 2018-08-23 DIAGNOSIS — J189 Pneumonia, unspecified organism: Secondary | ICD-10-CM

## 2018-08-23 DIAGNOSIS — J181 Lobar pneumonia, unspecified organism: Secondary | ICD-10-CM | POA: Diagnosis not present

## 2018-08-23 MED ORDER — BENZONATATE 100 MG PO CAPS
100.0000 mg | ORAL_CAPSULE | Freq: Three times a day (TID) | ORAL | 0 refills | Status: DC | PRN
Start: 2018-08-23 — End: 2018-10-11

## 2018-08-23 MED ORDER — AZITHROMYCIN 250 MG PO TABS: ORAL_TABLET | ORAL | 0 refills | Status: DC

## 2018-08-23 MED ORDER — GUAIFENESIN ER 600 MG PO TB12: 600.0000 mg | ORAL_TABLET | Freq: Two times a day (BID) | ORAL | 0 refills | Status: DC

## 2018-08-23 MED ORDER — ALBUTEROL SULFATE HFA 108 (90 BASE) MCG/ACT IN AERS: 2.0000 | INHALATION_SPRAY | RESPIRATORY_TRACT | 0 refills | Status: DC | PRN

## 2018-08-23 NOTE — Patient Instructions (Signed)
Community-Acquired Pneumonia, Adult Pneumonia is an infection of the lungs. One type of pneumonia can happen while a person is in a hospital. A different type can happen when a person is not in a hospital (community-acquired pneumonia). It is easy for this kind to spread from person to person. It can spread to you if you breathe near an infected person who coughs or sneezes. Some symptoms include:  A dry cough.  A wet (productive) cough.  Fever.  Sweating.  Chest pain.  Follow these instructions at home:  Take over-the-counter and prescription medicines only as told by your doctor. ? Only take cough medicine if you are losing sleep. ? If you were prescribed an antibiotic medicine, take it as told by your doctor. Do not stop taking the antibiotic even if you start to feel better.  Sleep with your head and neck raised (elevated). You can do this by putting a few pillows under your head, or you can sleep in a recliner.  Do not use tobacco products. These include cigarettes, chewing tobacco, and e-cigarettes. If you need help quitting, ask your doctor.  Drink enough water to keep your pee (urine) clear or pale yellow. A shot (vaccine) can help prevent pneumonia. Shots are often suggested for:  People older than 58 years of age.  People older than 58 years of age: ? Who are having cancer treatment. ? Who have Kugler-term (chronic) lung disease. ? Who have problems with their body's defense system (immune system).  You may also prevent pneumonia if you take these actions:  Get the flu (influenza) shot every year.  Go to the dentist as often as told.  Wash your hands often. If soap and water are not available, use hand sanitizer.  Contact a doctor if:  You have a fever.  You lose sleep because your cough medicine does not help. Get help right away if:  You are short of breath and it gets worse.  You have more chest pain.  Your sickness gets worse. This is very serious  if: ? You are an older adult. ? Your body's defense system is weak.  You cough up blood. This information is not intended to replace advice given to you by your health care provider. Make sure you discuss any questions you have with your health care provider. Document Released: 02/21/2008 Document Revised: 02/10/2016 Document Reviewed: 12/30/2014 Elsevier Interactive Patient Education  2018 [employer]  

## 2018-08-23 NOTE — Progress Notes (Signed)
Name: Jeffrey Harding   MRN: [id number]    DOB: 05-30-1961   Date:08/23/2018       Progress Note  Subjective  Chief Complaint  Chief Complaint  Patient presents with  . Cough    Onset- 2 weeks, unchanged, Yellow Phlegm, Sore Throat, Fever, Chills, Body Aches  . Nasal Congestion    SOB, Sore throat, Chest tightness has tried [facility] cold medication and Theraflu    HPI  PT presents with concern for productive cough (yellow and brown, thick), nasal congestion x2 weeks. Has had a couple of episodes of chest tightness and shortness of breath. Some subjective fevers and chills, has had some wheezing and a few episodes of body aches.  Denies chest pain, no sinus pain/pressure. Has taken OTC cold medication and theraflu. Using flonase a few times, but was too congested for it to go into his nose.   Patient Active Problem List   Diagnosis Date Noted  . Perennial allergic rhinitis with seasonal variation 06/18/2015  . H/O malignant neoplasm of prostate 06/18/2015  . History of prostatectomy 06/18/2015  . Vitamin D deficiency 06/18/2015  . Dyslipidemia 06/18/2015  . ED (erectile dysfunction) of organic origin 05/23/2012    Social History   Tobacco Use  . Smoking status: Never Smoker  . Smokeless tobacco: Never Used  Substance Use Topics  . Alcohol use: Yes    Alcohol/week: 0.0 standard drinks    Comment: rarely     Current Outpatient Medications:  .  Ascorbic Acid (VITAMIN C) 100 MG tablet, Take 1 tablet (100 mg total) by mouth daily., Disp: 30 tablet, Rfl: 0 .  aspirin EC 81 MG tablet, Take 1 tablet (81 mg total) by mouth daily., Disp: 30 tablet, Rfl: 0 .  atorvastatin (LIPITOR) 40 MG tablet, Take 1 tablet (40 mg total) by mouth daily., Disp: 90 tablet, Rfl: 3 .  cholecalciferol (VITAMIN D) 1000 units tablet, Take 1,000 Units by mouth daily., Disp: , Rfl:  .  fluticasone (FLONASE) 50 MCG/ACT nasal spray, SPRAY 2 SPRAYS INTO EACH NOSTRIL EVERY DAY, Disp: 16 g, Rfl: 0  No Known  Allergies  I personally reviewed active problem list, medication list, allergies with the patient/caregiver today.  ROS  Ten systems reviewed and is negative except as mentioned in HPI.  Objective  Vitals:   08/23/18 0916  BP: 118/86  Pulse: 93  Resp: 16  Temp: 97.9 F (36.6 C)  TempSrc: Oral  SpO2: 99%  Weight: 172 lb (78 kg)  Height: 5\' 9"  (1.753 m)   Body mass index is 25.4 kg/m.  Nursing Note and Vital Signs reviewed.  Physical Exam  Constitutional: Patient appears well-developed and well-nourished.  No distress.  HEENT: head atraumatic, normocephalic, pupils equal and reactive to light, Bilateral TM's without erythema or effusion,  bilateral maxillary and frontal sinuses are non-tender, neck supple without lymphadenopathy, throat within normal limits - no erythema or exudate, no tonsillar swelling Cardiovascular: Normal rate, regular rhythm and normal heart sounds.  No murmur heard. No BLE edema. Pulmonary/Chest: Effort normal and breath sounds clear bilaterally except for crackles in the LEFT lower lobe. No respiratory distress. Abdominal: Soft, bowel sounds normal, there is no tenderness, no HSM Psychiatric: Patient has a normal mood and affect. behavior is normal. Judgment and thought content normal.  No results found for this or any previous visit (from the past 72 hour(s)).  Assessment & Plan  1. Pneumonia of left lower lobe due to infectious organism (Jay) - azithromycin (ZITHROMAX) 250  MG tablet; Day1: Take 2 tablets. Days 2-5: Take 1 tablet daily.  Dispense: 6 tablet; Refill: 0 - guaiFENesin (MUCINEX) 600 MG 12 hr tablet; Take 1 tablet (600 mg total) by mouth 2 (two) times daily.  Dispense: 20 tablet; Refill: 0 - benzonatate (TESSALON PERLES) 100 MG capsule; Take 1 capsule (100 mg total) by mouth 3 (three) times daily as needed.  Dispense: 30 capsule; Refill: 0 - albuterol (PROVENTIL HFA;VENTOLIN HFA) 108 (90 Base) MCG/ACT inhaler; Inhale 2 puffs into the  lungs every 4 (four) hours as needed for wheezing or shortness of breath.  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  -Red flags and when to present for emergency care or RTC including fever >101.83F, chest pain, shortness of breath, new/worsening/un-resolving symptoms, reviewed with patient at time of visit. Follow up and care instructions discussed and provided in AVS.

## 2018-10-10 NOTE — Progress Notes (Signed)
10/11/2018 12:28 PM   Jeffrey Harding 1961-07-12 [id number]  Referring provider: Steele Sizer, MD [address] [place], [place] [address]  Chief Complaint  Patient presents with  . Prostate Cancer    New Patient  . Establish Care    HPI: 58 yo M referred today by his PCP for continued manamamgent of prostate cancer.    He was previously seen and followed at [facility].  He no longer wants to drive to [facility] and his insurance no longer covers [facility] thus would like to establish care with [facility].  He was initially diagnosed with prostate cancer on 05/31/2006 secondary to elevated PSA of 2.0.  He underwent prostate biopsy revealing 2 of 12 cores positive for Gleason 3+3 disease.  He did like to have a robotic prostatectomy (Dr. Christoper Fabian) 09/25/2006 pathologic pT2c, N0, Mx, R0 with a Gleason score of 3+3=6 with organ confined- disease.  PSA is remained stably low ever since, 0.01. His last PSA was 04/2018.  He also has a personal history of erectile dysfunction status post placement of inflatable penile prosthesis.  This was placed in 2009.  He is had no issues with the device, functions properly in good position.  He is very satisfied with this.   PMH: Past Medical History:  Diagnosis Date  . Allergy   . Cancer [facility]) 2008   prostate  . H/O prostatectomy   . Hyperlipidemia   . Over weight   . Vitamin D deficiency     Surgical History: Past Surgical History:  Procedure Laterality Date  . CYST EXCISION  2009   back  . LASER OF PROSTATE W/ GREEN LIGHT PVP  2008  . PENILE PROSTHESIS IMPLANT    . PROSTATECTOMY      Home Medications:  Allergies as of 10/11/2018   No Known Allergies     Medication List       Accurate as of October 11, 2018 12:28 PM. Always use your most recent med list.        albuterol 108 (90 Base) MCG/ACT inhaler Commonly known as:  PROVENTIL HFA;VENTOLIN HFA Inhale 2 puffs into the lungs every 4 (four) hours as needed  for wheezing or shortness of breath.   aspirin EC 81 MG tablet Take 1 tablet (81 mg total) by mouth daily.   atorvastatin 40 MG tablet Commonly known as:  LIPITOR Take 1 tablet (40 mg total) by mouth daily.   cholecalciferol 1000 units tablet Commonly known as:  VITAMIN D Take 1,000 Units by mouth daily.   vitamin C 100 MG tablet Take 1 tablet (100 mg total) by mouth daily.       Allergies: No Known Allergies  Family History: Family History  Problem Relation Age of Onset  . Hypertension Mother   . Hyperlipidemia Mother   . Heart disease Mother   . Diabetes Mother   . Heart disease Father   . Hyperlipidemia Father   . Hypertension Father   . Cancer Father        prostate  . Hypertension Sister   . Hyperlipidemia Sister     Social History:  reports that he has never smoked. He has never used smokeless tobacco. He reports current alcohol use. He reports that he does not use drugs.  ROS: UROLOGY Frequent Urination?: No Hard to postpone urination?: No Burning/pain with urination?: No Get up at night to urinate?: No Leakage of urine?: No Urine stream starts and stops?: No Trouble starting stream?: No Do  you have to strain to urinate?: No Blood in urine?: No Urinary tract infection?: No Sexually transmitted disease?: No Injury to kidneys or bladder?: No Painful intercourse?: No Weak stream?: No Erection problems?: Yes Penile pain?: No  Gastrointestinal Nausea?: No Vomiting?: No Indigestion/heartburn?: No Diarrhea?: No Constipation?: No  Constitutional Fever: No Night sweats?: No Weight loss?: No Fatigue?: No  Skin Skin rash/lesions?: No Itching?: No  Eyes Blurred vision?: No Double vision?: No  Ears/Nose/Throat Sore throat?: No Sinus problems?: No  Hematologic/Lymphatic Swollen glands?: No Easy bruising?: No  Cardiovascular Leg swelling?: No Chest pain?: No  Respiratory Cough?: No Shortness of breath?: No  Endocrine Excessive  thirst?: No  Musculoskeletal Back pain?: No Joint pain?: No  Neurological Headaches?: No Dizziness?: No  Psychologic Depression?: No Anxiety?: No  Physical Exam: BP 126/79   Pulse 67   Ht 5\' 9"  (1.753 m)   Wt 175 lb (79.4 kg)   BMI 25.84 kg/m   Constitutional:  Alert and oriented, No acute distress. HEENT: [place] AT, moist mucus membranes.  Trachea midline, no masses. Cardiovascular: No clubbing, cyanosis, or edema. Respiratory: Normal respiratory effort, no increased work of breathing. Skin: No rashes, bruises or suspicious lesions. Neurologic: Grossly intact, no focal deficits, moving all 4 extremities. Psychiatric: Normal mood and affect.  Laboratory Data: Lab Results  Component Value Date   WBC 5.0 07/19/2018   HGB 14.2 07/19/2018   HCT 42.1 07/19/2018   MCV 91.3 07/19/2018   PLT 202 07/19/2018    Lab Results  Component Value Date   CREATININE 0.97 07/19/2018   PSA as above  Lab Results  Component Value Date   HGBA1C 5.7 (H) 07/19/2018    Assessment & Plan:    1. Prostate cancer [facility]) Status post prostatectomy 2007 without recurrence Recommend continued annual PSA, will be due 04/2019 I will continue see him on an annual basis per his preference - PSA; Future  2. Erectile dysfunction after radical prostatectomy Personal history of erectile dysfunction with well functioning IPP Patient had some questions today about the general lifespan of the device, discussed that as [place] as the device is appropriate working condition, no need to replace until it malfunctions   Return in about 7 months (around 05/12/2019) for PSA lab visit only, then see again 04/2020 with PSA.  Hollice Espy, MD  [facility] [address], [place], [place] [address] [phone]

## 2018-10-11 ENCOUNTER — Ambulatory Visit (INDEPENDENT_AMBULATORY_CARE_PROVIDER_SITE_OTHER): Payer: BLUE CROSS/BLUE SHIELD | Admitting: Urology

## 2018-10-11 ENCOUNTER — Encounter: Payer: Self-pay | Admitting: Urology

## 2018-10-11 VITALS — BP 126/79 | HR 67 | Ht 69.0 in | Wt 175.0 lb

## 2018-10-11 DIAGNOSIS — C61 Malignant neoplasm of prostate: Secondary | ICD-10-CM

## 2018-10-11 DIAGNOSIS — N5231 Erectile dysfunction following radical prostatectomy: Secondary | ICD-10-CM

## 2019-01-31 ENCOUNTER — Telehealth: Payer: Self-pay | Admitting: Family Medicine

## 2019-01-31 NOTE — Telephone Encounter (Signed)
Copied from [place] [phone]. Topic: Quick Communication - Rx Refill/Question >> Jan 31, 2019  3:32 PM Wynetta Emery, [place] C wrote: Medication: fluticasone (FLONASE) 50 MCG/ACT nasal spray   Has the patient contacted their pharmacy? Yes  (Agent: If no, request that the patient contact the pharmacy for the refill.) (Agent: If yes, when and what did the pharmacy advise?)  Preferred Pharmacy (with phone number or street name): [id number] [place], [place] - [address] [phone] (Phone) [phone] (Fax)    Agent: Please be advised that RX refills may take up to 3 business days. We ask that you follow-up with your pharmacy.

## 2019-02-03 MED ORDER — FLUTICASONE PROPIONATE 50 MCG/ACT NA SUSP: 2.0000 | Freq: Every day | NASAL | 2 refills | Status: DC

## 2019-05-01 ENCOUNTER — Other Ambulatory Visit: Payer: Self-pay | Admitting: Family Medicine

## 2019-05-23 ENCOUNTER — Other Ambulatory Visit: Payer: Self-pay

## 2019-05-23 ENCOUNTER — Other Ambulatory Visit: Payer: BLUE CROSS/BLUE SHIELD

## 2019-05-23 DIAGNOSIS — C61 Malignant neoplasm of prostate: Secondary | ICD-10-CM | POA: Diagnosis not present

## 2019-05-24 LAB — PSA: Prostate Specific Ag, Serum: <0.1 ng/mL (ref 0.0–4.0)

## 2019-05-27 ENCOUNTER — Encounter: Payer: Self-pay | Admitting: Family Medicine

## 2019-07-25 ENCOUNTER — Ambulatory Visit: Payer: BLUE CROSS/BLUE SHIELD | Admitting: Family Medicine

## 2019-07-28 ENCOUNTER — Other Ambulatory Visit: Payer: Self-pay | Admitting: Family Medicine

## 2019-07-28 DIAGNOSIS — E785 Hyperlipidemia, unspecified: Secondary | ICD-10-CM

## 2019-09-11 ENCOUNTER — Ambulatory Visit (INDEPENDENT_AMBULATORY_CARE_PROVIDER_SITE_OTHER): Payer: BLUE CROSS/BLUE SHIELD | Admitting: Family Medicine

## 2019-09-11 ENCOUNTER — Encounter: Payer: Self-pay | Admitting: Family Medicine

## 2019-09-11 ENCOUNTER — Other Ambulatory Visit: Payer: Self-pay

## 2019-09-11 VITALS — BP 124/84 | HR 80 | Temp 98.9°F | Resp 17 | Ht 69.0 in | Wt 184.7 lb

## 2019-09-11 DIAGNOSIS — E785 Hyperlipidemia, unspecified: Secondary | ICD-10-CM | POA: Diagnosis not present

## 2019-09-11 DIAGNOSIS — Z79899 Other long term (current) drug therapy: Secondary | ICD-10-CM

## 2019-09-11 DIAGNOSIS — Z131 Encounter for screening for diabetes mellitus: Secondary | ICD-10-CM

## 2019-09-11 DIAGNOSIS — E559 Vitamin D deficiency, unspecified: Secondary | ICD-10-CM

## 2019-09-11 DIAGNOSIS — Z0000 Encounter for general adult medical examination without abnormal findings: Secondary | ICD-10-CM | POA: Diagnosis not present

## 2019-09-11 MED ORDER — ATORVASTATIN CALCIUM 40 MG PO TABS: 40.0000 mg | ORAL_TABLET | Freq: Every day | ORAL | 3 refills | Status: DC

## 2019-09-11 MED ORDER — FLUTICASONE PROPIONATE 50 MCG/ACT NA SUSP: 2.0000 | Freq: Every day | NASAL | 0 refills | Status: DC

## 2019-09-11 NOTE — Progress Notes (Signed)
Name: Jeffrey Harding   MRN: [id number]    DOB: May 01, 1961   Date:09/11/2019       Progress Note  Subjective  Chief Complaint  Chief Complaint  Patient presents with  . Annual Exam    CPE    HPI  Patient presents for annual CPE and follow up  Dyslipidemia: taking Atorvastatin, no muscle pain, no chest pain or palpitation. We will recheck labs today  Vitamin D def: he is taking otc supplementation , no fatigue, we will recheck labs today  History of prostate cancer: last visit with Urologist was 09/2018, but had a PSA done 05/2019. He has penile pump and is doing well.   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 Exercise: advised to increase physical activity   Depression: phq 9 is negative Depression screen [facility] 2/9 09/11/2019 09/11/2019 07/19/2018 07/13/2017 06/18/2015  Decreased Interest 0 0 0 0 0  Down, Depressed, Hopeless 0 0 2 0 0  PHQ - 2 Score 0 0 2 0 0  Altered sleeping 3 - 1 - -  Tired, decreased energy 0 - 0 - -  Change in appetite 0 - 0 - -  Feeling bad or failure about yourself  0 - 0 - -  Trouble concentrating 0 - 0 - -  Moving slowly or fidgety/restless 0 - 0 - -  Suicidal thoughts 0 - 0 - -  PHQ-9 Score 3 - 3 - -  Difficult doing work/chores Not difficult at all - Not difficult at all - -    Hypertension:  BP Readings from Last 3 Encounters:  09/11/19 124/84  10/11/18 126/79  08/23/18 118/86    Obesity: Wt Readings from Last 3 Encounters:  09/11/19 184 lb 11.2 oz (83.8 kg)  10/11/18 175 lb (79.4 kg)  08/23/18 172 lb (78 kg)   BMI Readings from Last 3 Encounters:  09/11/19 27.28 kg/m  10/11/18 25.84 kg/m  08/23/18 25.40 kg/m     Lipids:  Lab Results  Component Value Date   CHOL 106 07/19/2018   CHOL 129 07/13/2017   CHOL 111 (L) 07/07/2016   Lab Results  Component Value Date   HDL 38 (L) 07/19/2018   HDL 46  07/13/2017   HDL 47 07/07/2016   Lab Results  Component Value Date   LDLCALC 55 07/19/2018   LDLCALC 69 07/13/2017   LDLCALC 54 07/07/2016   Lab Results  Component Value Date   TRIG 57 07/19/2018   TRIG 56 07/13/2017   TRIG 52 07/07/2016   Lab Results  Component Value Date   CHOLHDL 2.8 07/19/2018   CHOLHDL 2.8 07/13/2017   CHOLHDL 2.4 07/07/2016   No results found for: LDLDIRECT Glucose:  Glucose, Bld  Date Value Ref Range Status  07/19/2018 99 65 - 99 mg/dL Final    Comment:    .            Fasting reference interval .   07/13/2017 104 (H) 65 - 99 mg/dL Final    Comment:    .            Fasting reference interval . For someone without known diabetes, a glucose value between 100 and 125 mg/dL is consistent with prediabetes and should be confirmed with a follow-up test. .   07/07/2016 87 65 - 99 mg/dL Final  [facility] Visit from 09/11/2019 in [facility]  AUDIT-C Score  1       Divorced STD testing and prevention (HIV/chl/gon/syphilis): not sexually active in over one year , not interested  Hep C: up to date  Skin cancer: Discussed monitoring for atypical lesions Colorectal cancer: up to date Prostate cancer: sees Dr. Erlene Quan, last PSA was normal 05/2019 - undetected, history of prostate cancer  Lung cancer:   Low Dose CT Chest recommended if Age 71-80 years, 30 pack-year currently smoking OR have quit w/in 15years. Patient does not qualify.   AAA: The USPSTF recommends one-time screening with ultrasonography in men ages 72 to 45 years who have ever smoked. Not a candidate  ECG:  2016   Advanced Care Planning: A voluntary discussion about advance care planning including the explanation and discussion of advance directives.  Discussed health care proxy and Living will, and the patient was able to identify a health care proxy as daughter .  Patient does not have a living will at present time. If patient does have living will, I have  requested they bring this to the clinic to be scanned in to their chart.  Patient Active Problem List   Diagnosis Date Noted  . Perennial allergic rhinitis with seasonal variation 06/18/2015  . H/O malignant neoplasm of prostate 06/18/2015  . History of prostatectomy 06/18/2015  . Vitamin D deficiency 06/18/2015  . Dyslipidemia 06/18/2015  . 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 CYST EXCISION  2009   back  . LASER OF PROSTATE W/ GREEN LIGHT PVP  2008  . PENILE PROSTHESIS IMPLANT    . PROSTATECTOMY      Family History  Problem Relation Age of Onset  . Hypertension Mother   . Hyperlipidemia Mother   . Heart disease Mother   . Diabetes Mother   . Heart disease Father   . Hyperlipidemia Father   . Hypertension Father   . Cancer Father        prostate  . Hypertension Sister   . Hyperlipidemia Sister   . Lupus Sister   . Hypertension Sister     Social History   Socioeconomic History  . Marital status: Divorced    Spouse name: Not on file  . Number of children: 1  . Years of education: 39  . Highest education level: High school graduate  Occupational History  . Occupation: truck Geophysicist/field seismologist  Tobacco Use  . Smoking status: Never Smoker  . Smokeless tobacco: Never Used  Substance and Sexual Activity  . Alcohol use: Yes    Alcohol/week: 0.0 standard drinks    Comment: rarely  . Drug use: No  . Sexual activity: Not Currently    Partners: Female  Other Topics Concern  . 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 Difficulty of Paying Living Expenses: Not on file  Food Insecurity:   . Worried About Charity fundraiser in the Last Year: Not on file  . Ran Out of Food in the Last Year: Not on file  Transportation Needs: No Transportation Needs  . Lack of  Transportation (Medical): No  . Lack of Transportation (Non-Medical): No  Physical Activity: Insufficiently Active  . Days of Exercise per Week: 2 days  .  Minutes of Exercise per Session: 40 min  Stress:   . Feeling of Stress : Not on file  Social Connections: Slightly Isolated  . Frequency of Communication with Friends and Family: More than three times a week  . Frequency of Social Gatherings with Friends and Family: More than three times a week  . Attends Religious Services: More than 4 times per year  . Active Member of Clubs or Organizations: Yes  . Attends Archivist Meetings: More than 4 times per year  . Marital Status: Divorced  Human resources officer Violence: Not At Risk  . Fear of Current or Ex-Partner: No  . Emotionally Abused: No  . Physically Abused: No  . Sexually Abused: No     Current Outpatient Medications:  .  albuterol (PROVENTIL HFA;VENTOLIN HFA) 108 (90 Base) MCG/ACT inhaler, Inhale 2 puffs into the lungs every 4 (four) hours as needed for wheezing or shortness of breath., Disp: 1 Inhaler, Rfl: 0 .  Ascorbic Acid (VITAMIN C) 100 MG tablet, Take 1 tablet (100 mg total) by mouth daily., Disp: 30 tablet, Rfl: 0 .  aspirin EC 81 MG tablet, Take 1 tablet (81 mg total) by mouth daily., Disp: 30 tablet, Rfl: 0 .  atorvastatin (LIPITOR) 40 MG tablet, TAKE 1 TABLET BY MOUTH EVERY DAY, Disp: 90 tablet, Rfl: 0 .  cholecalciferol (VITAMIN D) 1000 units tablet, Take 1,000 Units by mouth daily., Disp: , Rfl:  .  fluticasone (FLONASE) 50 MCG/ACT nasal spray, SPRAY 2 SPRAYS INTO EACH NOSTRIL EVERY DAY, Disp: 48 mL, Rfl: 0  No Known Allergies   ROS  Constitutional: Negative for fever, positive for weight change.  Respiratory: Negative for cough and shortness of breath.   Cardiovascular: Negative for chest pain or palpitations.  Gastrointestinal: Negative for abdominal pain, no bowel changes.  Musculoskeletal: Negative for gait problem or joint swelling.  Skin:  Negative for rash.  Neurological: Negative for dizziness or headache.  No other specific complaints in a complete review of systems (except as listed in HPI above).   Objective  Vitals:   09/11/19 0823  BP: 124/84  Pulse: 80  Resp: 17  Temp: 98.9 F (37.2 C)  SpO2: 98%  Weight: 184 lb 11.2 oz (83.8 kg)  Height: 5\' 9"  (1.753 m)    Body mass index is 27.28 kg/m.  Physical Exam  Constitutional: Patient appears well-developed and well-nourished. No distress.  HENT: Head: Normocephalic and atraumatic. Ears: B TMs ok, no erythema or effusion; Nose and oral exam not done  Eyes: Conjunctivae and EOM are normal. Pupils are equal, round, and reactive to light. No scleral icterus.  Neck: Normal range of motion. Neck supple. No JVD present. No thyromegaly present.  Cardiovascular: Normal rate, regular rhythm and normal heart sounds.  No murmur heard. No BLE edema. Pulmonary/Chest: Effort normal and breath sounds normal. No respiratory distress. Abdominal: Soft. Bowel sounds are normal, no distension. There is no tenderness. no masses MALE GENITALIA:not done, sees urologist  RECTAL: not done  Musculoskeletal: Normal range of motion, no joint effusions. No gross deformities Neurological: he is alert and oriented to person, place, and time. No cranial nerve deficit. Coordination, balance, strength, speech and gait are normal.  Skin: Skin is warm and dry. No rash noted. No erythema.  Psychiatric: Patient has a normal mood and affect. behavior is normal. Judgment and thought content normal.  PHQ2/9: Depression screen [facility] 2/9 09/11/2019 09/11/2019 07/19/2018 07/13/2017 06/18/2015  Decreased Interest 0 0 0 0 0  Down, Depressed, Hopeless  0 0 2 0 0  PHQ - 2 Score 0 0 2 0 0  Altered sleeping 3 - 1 - -  Tired, decreased energy 0 - 0 - -  Change in appetite 0 - 0 - -  Feeling bad or failure about yourself  0 - 0 - -  Trouble concentrating 0 - 0 - -  Moving slowly or fidgety/restless 0 - 0 - -   Suicidal thoughts 0 - 0 - -  PHQ-9 Score 3 - 3 - -  Difficult doing work/chores Not difficult at all - Not difficult at all - -    Fall Risk: Fall Risk  09/11/2019 08/23/2018 07/19/2018 07/13/2017 06/18/2015  Falls in the past year? 0 0 0 No No  Number falls in past yr: - 0 - - -     Assessment & Plan  1. Well adult exam   2. Dyslipidemia  - COMPLETE METABOLIC PANEL WITH GFR - Lipid panel - atorvastatin (LIPITOR) 40 MG tablet; Take 1 tablet (40 mg total) by mouth daily.  Dispense: 90 tablet; Refill: 3  3. Diabetes mellitus screening  - Hemoglobin A1c  4. [place]-term use of high-risk medication  - COMPLETE METABOLIC PANEL WITH GFR - CBC with Differential/Platelet  5. Vitamin D deficiency  - VITAMIN D 25 Hydroxy (Vit-D Deficiency, Fractures)  -Prostate cancer screening and PSA options (with potential risks and benefits of testing vs not testing) were discussed along with recent recs/guidelines. -USPSTF grade A and B recommendations reviewed with patient; age-appropriate recommendations, preventive care, screening tests, etc discussed and encouraged; healthy living encouraged; see AVS for patient education given to patient -Discussed importance of 150 minutes of physical activity weekly, eat two servings of fish weekly, eat one serving of tree nuts ( cashews, pistachios, pecans, almonds.Marland Kitchen) every other day, eat 6 servings of fruit/vegetables daily and drink plenty of water and avoid sweet beverages.

## 2019-09-11 NOTE — Patient Instructions (Signed)
Preventive Care 41-59 Years Old, Male Preventive care refers to lifestyle choices and visits with your health care provider that can promote health and wellness. This includes:  A yearly physical exam. This is also called an annual well check.  Regular dental and eye exams.  Immunizations.  Screening for certain conditions.  Healthy lifestyle choices, such as eating a healthy diet, getting regular exercise, not using drugs or products that contain nicotine and tobacco, and limiting alcohol use. What can I expect for my preventive care visit? Physical exam Your health care provider will check:  Height and weight. These may be used to calculate body mass index (BMI), which is a measurement that tells if you are at a healthy weight.  Heart rate and blood pressure.  Your skin for abnormal spots. Counseling Your health care provider may ask you questions about:  Alcohol, tobacco, and drug use.  Emotional well-being.  Home and relationship well-being.  Sexual activity.  Eating habits.  Work and work Statistician. What immunizations do I need?  Influenza (flu) vaccine  This is recommended every year. Tetanus, diphtheria, and pertussis (Tdap) vaccine  You may need a Td booster every 10 years. Varicella (chickenpox) vaccine  You may need this vaccine if you have not already been vaccinated. Zoster (shingles) vaccine  You may need this after age 64. Measles, mumps, and rubella (MMR) vaccine  You may need at least one dose of MMR if you were born in 1957 or later. You may also need a second dose. Pneumococcal conjugate (PCV13) vaccine  You may need this if you have certain conditions and were not previously vaccinated. Pneumococcal polysaccharide (PPSV23) vaccine  You may need one or two doses if you smoke cigarettes or if you have certain conditions. Meningococcal conjugate (MenACWY) vaccine  You may need this if you have certain conditions. Hepatitis A  vaccine  You may need this if you have certain conditions or if you travel or work in places where you may be exposed to hepatitis A. Hepatitis B vaccine  You may need this if you have certain conditions or if you travel or work in places where you may be exposed to hepatitis B. Haemophilus influenzae type b (Hib) vaccine  You may need this if you have certain risk factors. Human papillomavirus (HPV) vaccine  If recommended by your health care provider, you may need three doses over 6 months. You may receive vaccines as individual doses or as more than one vaccine together in one shot (combination vaccines). Talk with your health care provider about the risks and benefits of combination vaccines. What tests do I need? Blood tests  Lipid and cholesterol levels. These may be checked every 5 years, or more frequently if you are over 60 years old.  Hepatitis C test.  Hepatitis B test. Screening  Lung cancer screening. You may have this screening every year starting at age 43 if you have a 30-pack-year history of smoking and currently smoke or have quit within the past 15 years.  Prostate cancer screening. Recommendations will vary depending on your family history and other risks.  Colorectal cancer screening. All adults should have this screening starting at age 72 and continuing until age 2. Your health care provider may recommend screening at age 14 if you are at increased risk. You will have tests every 1-10 years, depending on your results and the type of screening test.  Diabetes screening. This is done by checking your blood sugar (glucose) after you have not eaten  for a while (fasting). You may have this done every 1-3 years.  Sexually transmitted disease (STD) testing. Follow these instructions at home: Eating and drinking  Eat a diet that includes fresh fruits and vegetables, whole grains, lean protein, and low-fat dairy products.  Take vitamin and mineral supplements as recommended  by your health care provider.  Do not drink alcohol if your health care provider tells you not to drink.  If you drink alcohol: ? Limit how much you have to 0-2 drinks a day. ? Be aware of how much alcohol is in your drink. In the [place] one drink equals one 12 oz bottle of beer (355 mL), one 5 oz glass of wine (148 mL), or one 1 oz glass of hard liquor (44 mL). Lifestyle  Take daily care of your teeth and gums.  Stay active. Exercise for at least 30 minutes on 5 or more days each week.  Do not use any products that contain nicotine or tobacco, such as cigarettes, e-cigarettes, and chewing tobacco. If you need help quitting, ask your health care provider.  If you are sexually active, practice safe sex. Use a condom or other form of protection to prevent STIs (sexually transmitted infections).  Talk with your health care provider about taking a low-dose aspirin every day starting at age 50. What's next?  Go to your health care provider once a year for a well check visit.  Ask your health care provider how often you should have your eyes and teeth checked.  Stay up to date on all vaccines. This information is not intended to replace advice given to you by your health care provider. Make sure you discuss any questions you have with your health care provider. Document Released: 10/01/2015 Document Revised: 08/29/2018 Document Reviewed: 08/29/2018 Elsevier Patient Education  2020 Elsevier Inc.   

## 2019-09-12 LAB — CBC WITH DIFFERENTIAL/PLATELET
Absolute Monocytes: 412 cells/uL (ref 200–950)
Basophils Absolute: 17 cells/uL (ref 0–200)
Basophils Relative: 0.3 %
Eosinophils Absolute: 70 cells/uL (ref 15–500)
Eosinophils Relative: 1.2 %
HCT: 44.1 % (ref 38.5–50.0)
Hemoglobin: 14.6 g/dL (ref 13.2–17.1)
Lymphs Abs: 1926 cells/uL (ref 850–3900)
MCH: 30.3 pg (ref 27.0–33.0)
MCHC: 33.1 g/dL (ref 32.0–36.0)
MCV: 91.5 fL (ref 80.0–100.0)
MPV: 10.9 fL (ref 7.5–12.5)
Monocytes Relative: 7.1 %
Neutro Abs: 3376 cells/uL (ref 1500–7800)
Neutrophils Relative %: 58.2 %
Platelets: 202 10*3/uL (ref 140–400)
RBC: 4.82 10*6/uL (ref 4.20–5.80)
RDW: 12.7 % (ref 11.0–15.0)
Total Lymphocyte: 33.2 %
WBC: 5.8 10*3/uL (ref 3.8–10.8)

## 2019-09-12 LAB — HEMOGLOBIN A1C
Hgb A1c MFr Bld: 5.6 % of total Hgb (ref <5.7)
Mean Plasma Glucose: 114 (calc)
eAG (mmol/L): 6.3 (calc)

## 2019-09-12 LAB — COMPLETE METABOLIC PANEL WITH GFR
AG Ratio: 1.6 (calc) (ref 1.0–2.5)
ALT: 108 U/L — ABNORMAL HIGH (ref 9–46)
AST: 61 U/L — ABNORMAL HIGH (ref 10–35)
Albumin: 4.1 g/dL (ref 3.6–5.1)
Alkaline phosphatase (APISO): 63 U/L (ref 35–144)
BUN: 14 mg/dL (ref 7–25)
CO2: 29 mmol/L (ref 20–32)
Calcium: 9 mg/dL (ref 8.6–10.3)
Chloride: 105 mmol/L (ref 98–110)
Creat: 0.94 mg/dL (ref 0.70–1.33)
GFR, Est African American: 102 mL/min/{1.73_m2} (ref >=60)
GFR, Est Non African American: 88 mL/min/{1.73_m2} (ref >=60)
Globulin: 2.6 g/dL (calc) (ref 1.9–3.7)
Glucose, Bld: 105 mg/dL — ABNORMAL HIGH (ref 65–99)
Potassium: 4.1 mmol/L (ref 3.5–5.3)
Sodium: 140 mmol/L (ref 135–146)
Total Bilirubin: 0.7 mg/dL (ref 0.2–1.2)
Total Protein: 6.7 g/dL (ref 6.1–8.1)

## 2019-09-12 LAB — LIPID PANEL
Cholesterol: 111 mg/dL (ref <200)
HDL: 39 mg/dL — ABNORMAL LOW (ref >=40)
LDL Cholesterol (Calc): 56 mg/dL (calc)
Non-HDL Cholesterol (Calc): 72 mg/dL (calc) (ref <130)
Total CHOL/HDL Ratio: 2.8 (calc) (ref <5.0)
Triglycerides: 75 mg/dL (ref <150)

## 2019-09-12 LAB — VITAMIN D 25 HYDROXY (VIT D DEFICIENCY, FRACTURES): Vit D, 25-Hydroxy: 29 ng/mL — ABNORMAL LOW (ref 30–100)

## 2019-09-15 ENCOUNTER — Other Ambulatory Visit: Payer: Self-pay | Admitting: Family Medicine

## 2019-09-15 DIAGNOSIS — R748 Abnormal levels of other serum enzymes: Secondary | ICD-10-CM

## 2019-10-17 DIAGNOSIS — R7989 Other specified abnormal findings of blood chemistry: Secondary | ICD-10-CM | POA: Diagnosis not present

## 2019-11-14 DIAGNOSIS — R7989 Other specified abnormal findings of blood chemistry: Secondary | ICD-10-CM | POA: Diagnosis not present

## 2019-12-06 ENCOUNTER — Other Ambulatory Visit: Payer: Self-pay | Admitting: Family Medicine

## 2020-03-12 DIAGNOSIS — C61 Malignant neoplasm of prostate: Secondary | ICD-10-CM | POA: Diagnosis not present

## 2020-03-12 DIAGNOSIS — N5231 Erectile dysfunction following radical prostatectomy: Secondary | ICD-10-CM | POA: Diagnosis not present

## 2020-04-28 ENCOUNTER — Other Ambulatory Visit: Payer: Self-pay

## 2020-04-28 DIAGNOSIS — C61 Malignant neoplasm of prostate: Secondary | ICD-10-CM

## 2020-04-30 ENCOUNTER — Other Ambulatory Visit: Payer: BLUE CROSS/BLUE SHIELD

## 2020-05-07 ENCOUNTER — Ambulatory Visit: Payer: BLUE CROSS/BLUE SHIELD | Admitting: Urology

## 2020-05-20 ENCOUNTER — Other Ambulatory Visit: Payer: Self-pay

## 2020-05-20 ENCOUNTER — Telehealth: Payer: Self-pay | Admitting: Urology

## 2020-05-20 ENCOUNTER — Other Ambulatory Visit: Payer: BLUE CROSS/BLUE SHIELD

## 2020-05-21 ENCOUNTER — Ambulatory Visit: Payer: BLUE CROSS/BLUE SHIELD | Admitting: Urology

## 2020-09-28 ENCOUNTER — Other Ambulatory Visit: Payer: Self-pay | Admitting: Family Medicine

## 2020-11-24 NOTE — Progress Notes (Signed)
Name: Jeffrey Harding   MRN: [id number]    DOB: 1960/11/26   Date:11/26/2020       Progress Note  Subjective  Chief Complaint  Annual Exam   HPI  Patient presents for annual CPE and follow up   Dyslipidemia: he was on Atorvastatin for many years, however last time his LFT"s spiked, he had a change in insurance and was seen at [facility] early 2021, had multiple tests and ferritin was high, he was advised to stay off statin therapy, he was not referred to GI or hematologist, we will recheck levels today.   Vitamin D def: he is taking otc supplementation , we will recheck level   History of prostate cancer: last visit with Urologist was 09/2018, but had a PSA done 05/2019, did not have one done last year. It was requested by Dr. Erlene Harding, he would like to see her again since back in network . He has penile pump and is doing well.   IPSS Questionnaire (AUA-7): Over the past month.   1)  How often have you had a sensation of not emptying your bladder completely after you finish urinating?  1 - Less than 1 time in 5  2)  How often have you had to urinate again less than two hours after you finished urinating? 0 - Not at all  3)  How often have you found you stopped and started again several times when you urinated?  0 - Not at all  4) How difficult have you found it to postpone urination?  0 - Not at all  5) How often have you had a weak urinary stream?  0 - Not at all  6) How often have you had to push or strain to begin urination?  0 - Not at all  7) How many times did you most typically get up to urinate from the time you went to bed until the time you got up in the morning?  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  Exercise: he drives 5 days a week, he loads and unloads the truck , on his days off he is going for  walks   Depression: phq 9 is negative Depression screen [facility] 2/9 11/26/2020 09/11/2019 09/11/2019 07/19/2018 07/13/2017  Decreased Interest 0 0 0 0 0  Down, Depressed, Hopeless 0 0 0 2 0  PHQ - 2 Score 0 0 0 2 0  Altered sleeping - 3 - 1 -  Tired, decreased energy - 0 - 0 -  Change in appetite - 0 - 0 -  Feeling bad or failure about yourself  - 0 - 0 -  Trouble concentrating - 0 - 0 -  Moving slowly or fidgety/restless - 0 - 0 -  Suicidal thoughts - 0 - 0 -  PHQ-9 Score - 3 - 3 -  Difficult doing work/chores - Not difficult at all - Not difficult at all -    Hypertension:  BP Readings from Last 3 Encounters:  11/26/20 134/86  09/11/19 124/84  10/11/18 126/79    Obesity: Wt Readings from Last 3 Encounters:  11/26/20 185 lb (83.9 kg)  09/11/19 184 lb 11.2 oz (83.8 kg)  10/11/18 175 lb (79.4 kg)   BMI Readings from Last 3 Encounters:  11/26/20 27.32 kg/m  09/11/19 27.28 kg/m  10/11/18 25.84 kg/m     Lipids:  Lab Results  Component Value Date   CHOL 111 09/11/2019   CHOL 106 07/19/2018   CHOL 129 07/13/2017   Lab Results  Component Value Date   HDL 39 (L) 09/11/2019   HDL 38 (L) 07/19/2018   HDL 46 07/13/2017   Lab Results  Component Value Date   LDLCALC 56 09/11/2019   LDLCALC 55 07/19/2018   LDLCALC 69 07/13/2017   Lab Results  Component Value Date   TRIG 75 09/11/2019   TRIG 57 07/19/2018   TRIG 56 07/13/2017   Lab Results  Component Value Date   CHOLHDL 2.8 09/11/2019   CHOLHDL 2.8 07/19/2018   CHOLHDL 2.8 07/13/2017   No results found for: LDLDIRECT Glucose:  Glucose, Bld  Date Value Ref Range Status  09/11/2019 105 (H) 65 - 99 mg/dL Final    Comment:    .            Fasting reference interval . For someone without known diabetes, a glucose value between 100 and 125 mg/dL is consistent with prediabetes and should be confirmed with a follow-up test. .   07/19/2018 99 65 - 99 mg/dL Final    Comment:    .            Fasting reference  interval .   07/13/2017 104 (H) 65 - 99 mg/dL Final    Comment:    .            Fasting reference interval . For someone without known diabetes, a glucose value between 100 and 125 mg/dL is consistent with prediabetes and should be confirmed with a follow-up test. .     [place] Office Visit from 09/11/2019 in [facility]  AUDIT-C Score 1      Divorced STD testing and prevention (HIV/chl/gon/syphilis):  Not interested  Hep C: 06/12/2014  Skin cancer: Discussed monitoring for atypical lesions Colorectal cancer: 07/31/2011 Prostate cancer: we will check PSA and send results to Urologist    Lung cancer:   Low Dose CT Chest recommended if Age 58-80 years, 30 pack-year currently smoking OR have quit w/in 15years. Patient does not qualify.   AAA:  The USPSTF recommends one-time screening with ultrasonography in men ages 15 to 61 years who have ever smoked ECG:  08/15/2015  Vaccines:   Shingrix: 76-64 yo and ask insurance if covered when patient above 36 yo, 2nd dose today  Pneumonia:  educated and discussed with patient. Flu: refused   Advanced Care Planning: A voluntary discussion about advance care planning including the explanation and discussion of advance directives.  Discussed health care proxy and Living will, and the patient was able to identify a health care proxy as daughter - Jeffrey Harding .    Patient Active Problem List   Diagnosis Date Noted  . Perennial allergic rhinitis with seasonal variation 06/18/2015  . H/O malignant neoplasm of prostate 06/18/2015  . History of prostatectomy 06/18/2015  . Vitamin D deficiency 06/18/2015  . Dyslipidemia 06/18/2015  . 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 CYST EXCISION  2009   back  . LASER OF PROSTATE W/ GREEN LIGHT PVP  2008  . PENILE PROSTHESIS IMPLANT    . PROSTATECTOMY      Family History  Problem Relation  Age of Onset  . Hypertension Mother   .  Hyperlipidemia Mother   . Heart disease Mother   . Diabetes Mother   . Heart disease Father   . Hyperlipidemia Father   . Hypertension Father   . Cancer Father        prostate  . Hypertension Sister   . Hyperlipidemia Sister   . Lupus Sister   . Hypertension Sister     Social History   Socioeconomic History  . Marital status: Divorced    Spouse name: Not on file  . Number of children: 1  . Years of education: 59  . Highest education level: High school graduate  Occupational History  . Occupation: truck Geophysicist/field seismologist  Tobacco Use  . Smoking status: Never Smoker  . Smokeless tobacco: Never Used  Vaping Use  . Vaping Use: Never used  Substance and Sexual Activity  . Alcohol use: Yes    Alcohol/week: 0.0 standard drinks    Comment: rarely  . Drug use: No  . Sexual activity: Not Currently    Partners: Female  Other Topics Concern  . 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 Difficulty of Paying Living Expenses: Not hard at all  Food Insecurity: No Food Insecurity  . Worried About Charity fundraiser in the Last Year: Never true  . Ran Out of Food in the Last Year: Never true  Transportation Needs: No Transportation Needs  . Lack of Transportation (Medical): No  . Lack of Transportation (Non-Medical): No  Physical Activity: Sufficiently Active  . Days of Exercise per Week: 3 days  . Minutes of Exercise per Session: 60 min  Stress: No Stress Concern Present  . Feeling of Stress : Not at all  Social Connections: Socially Isolated  . Frequency of Communication with Friends and Family: Once a week  . Frequency of Social Gatherings with Friends and Family: Never  . Attends Religious Services: More than 4 times per year  . Active Member of Clubs or Organizations: No  . Attends Theatre manager Meetings: Never  . Marital Status: Divorced  Human resources officer Violence: Not At Risk  . Fear of Current or Ex-Partner: No  . Emotionally Abused: No  . Physically Abused: No  . Sexually Abused: No     Current Outpatient Medications:  .  Ascorbic Acid (VITAMIN C) 100 MG tablet, Take 1 tablet (100 mg total) by mouth daily., Disp: 30 tablet, Rfl: 0 .  aspirin EC 81 MG tablet, Take 1 tablet (81 mg total) by mouth daily., Disp: 30 tablet, Rfl: 0 .  cholecalciferol (VITAMIN D) 1000 units tablet, Take 1,000 Units by mouth daily., Disp: , Rfl:  .  atorvastatin (LIPITOR) 40 MG tablet, Take 1 tablet (40 mg total) by mouth daily. (Patient not taking: Reported on 11/26/2020), Disp: 90 tablet, Rfl: 3  No Known Allergies   ROS  Constitutional: Negative for fever or weight change.  Respiratory: Negative for cough and shortness of breath.   Cardiovascular: Negative for chest pain or palpitations.  Gastrointestinal: Negative for abdominal pain, no bowel changes.  Musculoskeletal: Negative for gait problem or joint swelling.  Skin: Negative for rash.  Neurological: Negative for dizziness or headache.  No other specific complaints in a complete review of systems (except as listed in HPI above).  Objective  Vitals:   11/26/20 0826  BP: 134/86  Pulse: 89  Resp: 16  Temp: 98.2 F (36.8 C)  TempSrc: Oral  SpO2: 97%  Weight: 185 lb (83.9 kg)  Height: 5\' 9"  (1.753 m)    Body mass index is 27.32 kg/m.  Physical Exam  Constitutional: Patient appears well-developed and well-nourished. No distress.  HENT: Head: Normocephalic and atraumatic. Ears: B TMs ok, no erythema or effusion; Nose: Not done  Mouth/Throat: not done  Eyes: Conjunctivae and EOM are normal. Pupils are equal, round, and reactive to light. No scleral icterus.  Neck: Normal range of motion. Neck supple. No JVD present. No thyromegaly present.  Cardiovascular: Normal rate, regular rhythm and normal heart sounds.  No  murmur heard. No BLE edema. Pulmonary/Chest: Effort normal and breath sounds normal. No respiratory distress. Abdominal: Soft. Bowel sounds are normal, no distension. There is no tenderness. no masses MALE GENITALIA: Normal descended testes bilaterally, no masses palpated, no hernias, no lesions, no discharge RECTAL: not done  Musculoskeletal: Normal range of motion, no joint effusions. No gross deformities Neurological: he is alert and oriented to person, place, and time. No cranial nerve deficit. Coordination, balance, strength, speech and gait are normal.  Skin: Skin is warm and dry. No rash noted. No erythema.  Psychiatric: Patient has a normal mood and affect. behavior is normal. Judgment and thought content normal.   Fall Risk: Fall Risk  11/26/2020 09/11/2019 08/23/2018 07/19/2018 07/13/2017  Falls in the past year? 0 0 0 0 No  Number falls in past yr: 0 - 0 - -  Injury with Fall? 0 - - - -     Functional Status Survey: Is the patient deaf or have difficulty hearing?: No Does the patient have difficulty seeing, even when wearing glasses/contacts?: No Does the patient have difficulty concentrating, remembering, or making decisions?: No Does the patient have difficulty walking or climbing stairs?: No Does the patient have difficulty dressing or bathing?: No Does the patient have difficulty doing errands alone such as visiting a doctor's office or shopping?: No    Assessment & Plan  1. Elevated liver enzymes  - COMPLETE METABOLIC PANEL WITH GFR  2. Well adult exam  - Lipid panel - COMPLETE METABOLIC PANEL WITH GFR - CBC with Differential/Platelet - Hemoglobin A1c  3. Dyslipidemia  - Lipid panel  4. Need for diphtheria-tetanus-pertussis (Tdap) vaccine  - Tdap vaccine greater than or equal to 7yo IM  5. Vitamin D deficiency  - VITAMIN D 25 Hydroxy (Vit-D Deficiency, Fractures)  6. [place]-term use of high-risk medication  - CBC with Differential/Platelet  7.  Diabetes mellitus screening  - Hemoglobin A1c  8. History of prostatectomy  - PSA  9. H/O malignant neoplasm of prostate  - PSA  10. Elevated ferritin  - Ferritin  11. Need for shingles vaccine  - Varicella-zoster vaccine IM   -Prostate cancer screening and PSA options (with potential risks and benefits of testing vs not testing) were discussed along with recent recs/guidelines. -USPSTF grade A and B recommendations reviewed with patient; age-appropriate recommendations, preventive care, screening tests, etc discussed and encouraged; healthy living encouraged; see AVS for patient education given to patient -Discussed importance of 150 minutes of physical activity weekly, eat two servings of fish weekly, eat one serving of tree nuts ( cashews, pistachios, pecans, almonds.Marland Kitchen) every other day, eat 6 servings of fruit/vegetables daily and drink plenty of water and avoid sweet beverages.

## 2020-11-26 ENCOUNTER — Ambulatory Visit (INDEPENDENT_AMBULATORY_CARE_PROVIDER_SITE_OTHER): Payer: BC Managed Care – PPO | Admitting: Family Medicine

## 2020-11-26 ENCOUNTER — Encounter: Payer: Self-pay | Admitting: Family Medicine

## 2020-11-26 ENCOUNTER — Other Ambulatory Visit: Payer: Self-pay

## 2020-11-26 VITALS — BP 134/86 | HR 89 | Temp 98.2°F | Resp 16 | Ht 69.0 in | Wt 185.0 lb

## 2020-11-26 DIAGNOSIS — Z131 Encounter for screening for diabetes mellitus: Secondary | ICD-10-CM

## 2020-11-26 DIAGNOSIS — E559 Vitamin D deficiency, unspecified: Secondary | ICD-10-CM | POA: Diagnosis not present

## 2020-11-26 DIAGNOSIS — E785 Hyperlipidemia, unspecified: Secondary | ICD-10-CM

## 2020-11-26 DIAGNOSIS — Z79899 Other long term (current) drug therapy: Secondary | ICD-10-CM | POA: Diagnosis not present

## 2020-11-26 DIAGNOSIS — R7989 Other specified abnormal findings of blood chemistry: Secondary | ICD-10-CM | POA: Diagnosis not present

## 2020-11-26 DIAGNOSIS — R748 Abnormal levels of other serum enzymes: Secondary | ICD-10-CM

## 2020-11-26 DIAGNOSIS — Z23 Encounter for immunization: Secondary | ICD-10-CM

## 2020-11-26 DIAGNOSIS — Z8546 Personal history of malignant neoplasm of prostate: Secondary | ICD-10-CM

## 2020-11-26 DIAGNOSIS — Z0000 Encounter for general adult medical examination without abnormal findings: Secondary | ICD-10-CM | POA: Diagnosis not present

## 2020-11-26 DIAGNOSIS — Z9079 Acquired absence of other genital organ(s): Secondary | ICD-10-CM

## 2020-11-26 NOTE — Patient Instructions (Signed)
[employer]

## 2020-11-29 ENCOUNTER — Telehealth: Payer: Self-pay

## 2020-11-29 DIAGNOSIS — R7989 Other specified abnormal findings of blood chemistry: Secondary | ICD-10-CM

## 2020-11-29 NOTE — Telephone Encounter (Signed)
Labs added.

## 2020-12-01 LAB — VITAMIN D 25 HYDROXY (VIT D DEFICIENCY, FRACTURES): Vit D, 25-Hydroxy: 36 ng/mL (ref 30–100)

## 2020-12-01 LAB — COMPLETE METABOLIC PANEL WITH GFR
AG Ratio: 1.5 (calc) (ref 1.0–2.5)
ALT: 25 U/L (ref 9–46)
AST: 19 U/L (ref 10–35)
Albumin: 4.2 g/dL (ref 3.6–5.1)
Alkaline phosphatase (APISO): 54 U/L (ref 35–144)
BUN: 13 mg/dL (ref 7–25)
CO2: 29 mmol/L (ref 20–32)
Calcium: 9.2 mg/dL (ref 8.6–10.3)
Chloride: 105 mmol/L (ref 98–110)
Creat: 0.98 mg/dL (ref 0.70–1.25)
GFR, Est African American: 97 mL/min/{1.73_m2} (ref >=60)
GFR, Est Non African American: 83 mL/min/{1.73_m2} (ref >=60)
Globulin: 2.8 g/dL (calc) (ref 1.9–3.7)
Glucose, Bld: 100 mg/dL — ABNORMAL HIGH (ref 65–99)
Potassium: 4 mmol/L (ref 3.5–5.3)
Sodium: 140 mmol/L (ref 135–146)
Total Bilirubin: 0.9 mg/dL (ref 0.2–1.2)
Total Protein: 7 g/dL (ref 6.1–8.1)

## 2020-12-01 LAB — CBC WITH DIFFERENTIAL/PLATELET
Absolute Monocytes: 386 cells/uL (ref 200–950)
Basophils Absolute: 28 cells/uL (ref 0–200)
Basophils Relative: 0.6 %
Eosinophils Absolute: 51 cells/uL (ref 15–500)
Eosinophils Relative: 1.1 %
HCT: 43.7 % (ref 38.5–50.0)
Hemoglobin: 15 g/dL (ref 13.2–17.1)
Lymphs Abs: 1610 cells/uL (ref 850–3900)
MCH: 31.5 pg (ref 27.0–33.0)
MCHC: 34.3 g/dL (ref 32.0–36.0)
MCV: 91.8 fL (ref 80.0–100.0)
MPV: 11.6 fL (ref 7.5–12.5)
Monocytes Relative: 8.4 %
Neutro Abs: 2525 cells/uL (ref 1500–7800)
Neutrophils Relative %: 54.9 %
Platelets: 199 10*3/uL (ref 140–400)
RBC: 4.76 10*6/uL (ref 4.20–5.80)
RDW: 13.1 % (ref 11.0–15.0)
Total Lymphocyte: 35 %
WBC: 4.6 10*3/uL (ref 3.8–10.8)

## 2020-12-01 LAB — HEMOGLOBIN A1C
Hgb A1c MFr Bld: 5.5 % of total Hgb (ref <5.7)
Mean Plasma Glucose: 111 mg/dL
eAG (mmol/L): 6.2 mmol/L

## 2020-12-01 LAB — LIPID PANEL
Cholesterol: 185 mg/dL (ref <200)
HDL: 41 mg/dL (ref >=40)
LDL Cholesterol (Calc): 124 mg/dL (calc) — ABNORMAL HIGH
Non-HDL Cholesterol (Calc): 144 mg/dL (calc) — ABNORMAL HIGH (ref <130)
Total CHOL/HDL Ratio: 4.5 (calc) (ref <5.0)
Triglycerides: 92 mg/dL (ref <150)

## 2020-12-01 LAB — PSA: PSA: <0.04 ng/mL (ref <=4.0)

## 2020-12-01 LAB — FERRITIN: Ferritin: 673 ng/mL — ABNORMAL HIGH (ref 24–380)

## 2020-12-01 LAB — C-REACTIVE PROTEIN: CRP: 0.4 mg/L (ref <8.0)

## 2021-03-12 ENCOUNTER — Other Ambulatory Visit: Payer: Self-pay

## 2021-03-12 ENCOUNTER — Emergency Department
Admission: EM | Admit: 2021-03-12 | Discharge: 2021-03-12 | Disposition: A | Discharge status: Home / Self Care | Payer: BC Managed Care – PPO | Attending: Emergency Medicine | Admitting: Emergency Medicine

## 2021-03-12 ENCOUNTER — Emergency Department: Payer: BC Managed Care – PPO

## 2021-03-12 DIAGNOSIS — Z8546 Personal history of malignant neoplasm of prostate: Secondary | ICD-10-CM | POA: Diagnosis not present

## 2021-03-12 DIAGNOSIS — M7061 Trochanteric bursitis, right hip: Secondary | ICD-10-CM | POA: Diagnosis not present

## 2021-03-12 DIAGNOSIS — M25551 Pain in right hip: Secondary | ICD-10-CM | POA: Diagnosis not present

## 2021-03-12 DIAGNOSIS — Y939 Activity, unspecified: Secondary | ICD-10-CM | POA: Insufficient documentation

## 2021-03-12 MED ORDER — KETOROLAC TROMETHAMINE 60 MG/2ML IM SOLN
60.0000 mg | Freq: Once | INTRAMUSCULAR | Status: AC
  Administered 2021-03-12: 60 mg via INTRAMUSCULAR
  Filled 2021-03-12: qty 2

## 2021-03-12 MED ORDER — LIDOCAINE 5 % EX PTCH: 1.0000 | MEDICATED_PATCH | Freq: Two times a day (BID) | CUTANEOUS | 0 refills | Status: DC

## 2021-03-12 MED ORDER — NAPROXEN 375 MG PO TABS: 375.0000 mg | ORAL_TABLET | Freq: Two times a day (BID) | ORAL | 0 refills | Status: DC

## 2021-03-12 MED ORDER — LIDOCAINE 5 % EX PTCH
1.0000 | MEDICATED_PATCH | CUTANEOUS | Status: DC
  Administered 2021-03-12: 1 via TRANSDERMAL
  Filled 2021-03-12: qty 1

## 2021-03-12 NOTE — ED Provider Notes (Signed)
[facility] Emergency Department Provider Note   ____________________________________________   Event Date/Time   First MD Initiated Contact with Patient 03/12/21 0935     (approximate)  I have reviewed the triage vital signs and the nursing notes.   HISTORY  Chief Complaint Hip Pain    HPI Jeffrey Harding is a 60 y.o. male patient complain right hip pain.  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  Patient stated he awaking Wednesday with right hip pain.  Patient denies radicular component to his hip pain.  Denies any provocative for complaint.  Date history of sciatica many years ago but this pain is localized.  It has increased in the last 2 days.  Rates pain as 8/10.  Described pain as "achy".  No palliative measure for complaint.      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 Authorizing Provider  lidocaine (LIDODERM) 5 % Place 1 patch onto the skin every 12 (twelve) hours. Remove & Discard patch within 12 hours or as directed by MD 03/12/21 03/12/22 Yes Sable Feil, PA-C  naproxen (NAPROSYN) 375 MG tablet Take 1 tablet (375 mg total) by mouth 2 (two) times daily with a meal. 03/12/21  Yes Sable Feil, PA-C   Ascorbic Acid (VITAMIN C) 100 MG tablet Take 1 tablet (100 mg total) by mouth daily. 07/19/18   Steele Sizer, MD  cholecalciferol (VITAMIN D) 1000 units tablet Take 1,000 Units by mouth daily.    [provider]    Allergies Patient has no known allergies.  Family History  Problem Relation Age of Onset   Hypertension Mother    Hyperlipidemia Mother    Heart disease Mother    Diabetes Mother    Heart disease Father    Hyperlipidemia Father    Hypertension Father    Cancer Father        prostate   Hypertension Sister    Hyperlipidemia Sister    Lupus Sister    Hypertension Sister     Social History Social History   Tobacco Use   Smoking status: Never   Smokeless tobacco: Never  Vaping Use   Vaping Use: Never used  Substance Use Topics   Alcohol use: Yes    Alcohol/week: 0.0 standard drinks    Comment: rarely   Drug use: No    Review of Systems Constitutional: No fever/chills Eyes: No visual changes. ENT: No sore throat. Cardiovascular: Denies chest pain. Respiratory: Denies shortness of breath. Gastrointestinal: No abdominal pain.  No nausea, no vomiting.  No diarrhea.  No constipation. Genitourinary: Negative for dysuria. Musculoskeletal: Right lateral  hip pain. Skin: Negative for rash. Neurological: Negative for headaches, focal weakness or numbness. ___________________________________________   PHYSICAL EXAM:  VITAL SIGNS: ED Triage Vitals [03/12/21 0924]  Enc Vitals Group     BP (!) 146/99     Pulse Rate 84     Resp 16     Temp 98.3 F (36.8 C)     Temp Source Oral     SpO2 99 %     Weight 185 lb (83.9 kg)     Height 5\' 9"  (1.753 m)     Head Circumference      Peak Flow      Pain Score 8     Pain Loc      Pain Edu?      Excl. in [place]?     Constitutional: Alert and oriented. Well appearing and in no acute distress. Cardiovascular: Normal rate, regular rhythm. Grossly normal heart sounds.  Good peripheral circulation.  Elevated  blood pressure. Respiratory: Normal respiratory effort.  No retractions. Lungs CTAB. Gastrointestinal: Soft and nontender. No distention. No abdominal bruits. No CVA tenderness. Genitourinary: Deferred Musculoskeletal: No obvious deformity of the right hip.  No leg length discrepancy.  Patient has moderate guarding with palpation to greater trochanter area. Neurologic:  Normal speech and language. No gross focal neurologic deficits are appreciated. No gait instability. Skin:  Skin is warm, dry and intact. No rash noted. Psychiatric: Mood and affect are normal. Speech and behavior are normal.  ____________________________________________   LABS (all labs ordered are listed, but only abnormal results are displayed)  Labs Reviewed - No data to display ____________________________________________  EKG   ____________________________________________  RADIOLOGY I, Sable Feil, personally viewed and evaluated these images (plain radiographs) as part of my medical decision making, as well as reviewing the written report by the radiologist.  ED MD interpretation: Calcified lesion adjacent to the greater trochanter right hip.  Official radiology report(s): No results found.  ____________________________________________   PROCEDURES  Procedure(s) performed (including Critical Care):  Procedures   ____________________________________________   INITIAL IMPRESSION / ASSESSMENT AND PLAN / ED COURSE  As part of my medical decision making, I reviewed the following data within the [place]         Patient presents with right lateral hip pain for 3 days.  Discussed x-ray findings with patient.  Patient complaint and physical exam consistent with bursitis.  Patient given discharge care instruction will follow orthopedic for definitive valuation treatment.  Patient given prescription for naproxen and Lidoderm patches.       ____________________________________________   FINAL CLINICAL IMPRESSION(S) / ED DIAGNOSES  Final diagnoses:  Trochanteric bursitis of right hip     ED Discharge Orders          Ordered    naproxen (NAPROSYN) 375 MG tablet  2 times daily with meals        03/12/21 1016    lidocaine (LIDODERM) 5 %  Every 12 hours        03/12/21 1016             Note:  This document was prepared using Dragon voice recognition software and may include unintentional dictation errors.    Sable Feil, PA-C 03/12/21 1019    Vladimir Crofts, MD 03/12/21 1520

## 2021-03-12 NOTE — ED Triage Notes (Signed)
Pt arrives to ER c/o R hip pain. 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 States woke up with pain to R hip. Pain since. Hx sciatica. Denies pain radiating down R leg. Denies injury to R hip, denies falling or hitting it.

## 2021-03-12 NOTE — Discharge Instructions (Addendum)
Read and follow discharge care instructions.  Take medication as directed.

## 2021-04-22 DIAGNOSIS — M7541 Impingement syndrome of right shoulder: Secondary | ICD-10-CM | POA: Diagnosis not present

## 2021-07-25 DIAGNOSIS — Z8546 Personal history of malignant neoplasm of prostate: Secondary | ICD-10-CM | POA: Diagnosis not present

## 2021-07-25 DIAGNOSIS — Z9079 Acquired absence of other genital organ(s): Secondary | ICD-10-CM | POA: Diagnosis not present

## 2021-07-25 DIAGNOSIS — N289 Disorder of kidney and ureter, unspecified: Secondary | ICD-10-CM | POA: Diagnosis not present

## 2021-07-25 DIAGNOSIS — T673XXA Heat exhaustion, anhydrotic, initial encounter: Secondary | ICD-10-CM | POA: Diagnosis not present

## 2021-07-25 DIAGNOSIS — R55 Syncope and collapse: Secondary | ICD-10-CM | POA: Diagnosis not present

## 2021-07-25 DIAGNOSIS — Z9089 Acquired absence of other organs: Secondary | ICD-10-CM | POA: Diagnosis not present

## 2021-07-28 ENCOUNTER — Other Ambulatory Visit: Payer: Self-pay

## 2021-07-28 ENCOUNTER — Encounter: Payer: Self-pay | Admitting: Unknown Physician Specialty

## 2021-07-28 ENCOUNTER — Ambulatory Visit (INDEPENDENT_AMBULATORY_CARE_PROVIDER_SITE_OTHER): Payer: BC Managed Care – PPO | Admitting: Unknown Physician Specialty

## 2021-07-28 VITALS — BP 110/88 | HR 84 | Temp 97.9°F | Resp 18 | Ht 69.0 in | Wt 171.3 lb

## 2021-07-28 DIAGNOSIS — E860 Dehydration: Secondary | ICD-10-CM | POA: Diagnosis not present

## 2021-07-28 NOTE — Progress Notes (Signed)
BP 110/88   Pulse 84   Temp 97.9 F (36.6 C) (Oral)   Resp 18   Ht 5\' 9"  (1.753 m)   Wt 171 lb 4.8 oz (77.7 kg)   SpO2 98%   BMI 25.30 kg/m    Subjective:    Patient ID: Jeffrey Harding, male    DOB: 04/14/1961, 60 y.o.   MRN: [id number]  HPI: Jeffrey Harding is a 60 y.o. male  Chief Complaint  Patient presents with   Follow-up    Seen in ER in [facility]. Passed out 2 times in one day. Heat exhaustion and dehydration   Pt was seen in the ER on 11/7 after passing out x2 for dehydration and heat exhaustion.  He received 5 liters of fluid in the ER.  States he feels fine today.  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  Lab review shows elevated creatnine, CK, and mildly elevated WBC all consistent with heat exhaustion.  EKG was within nornal limits  Relevant past medical, surgical, family and social history reviewed and updated as indicated. Interim medical history since our last visit reviewed. Allergies and medications reviewed and updated.  Review of Systems  Constitutional: Negative.   HENT:  Negative for congestion and sore throat.   Respiratory: Negative.    Cardiovascular:  Negative for chest pain and leg swelling.  Gastrointestinal:  Negative for diarrhea and nausea.   Per HPI unless specifically indicated above     Objective:    BP 110/88   Pulse 84   Temp 97.9 F (36.6 C) (Oral)   Resp 18   Ht 5\' 9"  (1.753 m)   Wt 171 lb 4.8 oz (77.7 kg)   SpO2 98%   BMI 25.30 kg/m   Wt Readings from Last 3 Encounters:  07/28/21 171 lb 4.8 oz (77.7 kg)  03/12/21 185 lb (83.9 kg)  11/26/20 185 lb (83.9 kg)    Physical Exam Constitutional:      General: He is not in acute distress.    Appearance: Normal appearance. He is well-developed.  HENT:     Head: Normocephalic and atraumatic.  Eyes:     General: Lids are normal. No scleral icterus.       Right eye: No discharge.        Left eye: No discharge.     Conjunctiva/sclera: Conjunctivae normal.  Neck:     Vascular: No  carotid bruit or JVD.  Cardiovascular:     Rate and Rhythm: Normal rate and regular rhythm.     Heart sounds: Normal heart sounds.  Pulmonary:     Effort: Pulmonary effort is normal. No respiratory distress.     Breath sounds: Normal breath sounds.  Abdominal:     Palpations: There is no hepatomegaly or splenomegaly.  Musculoskeletal:        General: Normal range of motion.     Cervical back: Normal range of motion and neck supple.  Skin:    General: Skin is warm and dry.     Coloration: Skin is not pale.     Findings: No rash.  Neurological:     Mental Status: He is alert and oriented to person, place, and time.  Psychiatric:        Behavior: Behavior normal.        Thought Content: Thought content normal.        Judgment: Judgment normal.      Assessment & Plan:   Problem List Items Addressed This Visit  None Visit Diagnoses     Dehydration    -  Primary   Pt seems to have been recovered from his dehydration.  Pt ed on water and electrolyte drinks.  Stop baby ASA due to lack of documented benefit.     Relevant Orders   COMPLETE METABOLIC PANEL WITH GFR   CBC with Differential/Platelet   CK (Creatine Kinase)       Recheck abnormal labs from the ER  Follow up plan: Return if symptoms worsen or fail to improve.

## 2021-07-29 LAB — COMPLETE METABOLIC PANEL WITH GFR
AG Ratio: 1.4 (calc) (ref 1.0–2.5)
ALT: 24 U/L (ref 9–46)
AST: 20 U/L (ref 10–35)
Albumin: 4 g/dL (ref 3.6–5.1)
Alkaline phosphatase (APISO): 47 U/L (ref 35–144)
BUN: 12 mg/dL (ref 7–25)
CO2: 28 mmol/L (ref 20–32)
Calcium: 9.2 mg/dL (ref 8.6–10.3)
Chloride: 103 mmol/L (ref 98–110)
Creat: 1.03 mg/dL (ref 0.70–1.35)
Globulin: 2.8 g/dL (calc) (ref 1.9–3.7)
Glucose, Bld: 102 mg/dL — ABNORMAL HIGH (ref 65–99)
Potassium: 4 mmol/L (ref 3.5–5.3)
Sodium: 140 mmol/L (ref 135–146)
Total Bilirubin: 0.7 mg/dL (ref 0.2–1.2)
Total Protein: 6.8 g/dL (ref 6.1–8.1)
eGFR: 83 mL/min/{1.73_m2} (ref >=60)

## 2021-07-29 LAB — CBC WITH DIFFERENTIAL/PLATELET
Absolute Monocytes: 351 cells/uL (ref 200–950)
Basophils Absolute: 18 cells/uL (ref 0–200)
Basophils Relative: 0.4 %
Eosinophils Absolute: 41 cells/uL (ref 15–500)
Eosinophils Relative: 0.9 %
HCT: 43 % (ref 38.5–50.0)
Hemoglobin: 14.2 g/dL (ref 13.2–17.1)
Lymphs Abs: 1436 cells/uL (ref 850–3900)
MCH: 30.5 pg (ref 27.0–33.0)
MCHC: 33 g/dL (ref 32.0–36.0)
MCV: 92.3 fL (ref 80.0–100.0)
MPV: 11.5 fL (ref 7.5–12.5)
Monocytes Relative: 7.8 %
Neutro Abs: 2655 cells/uL (ref 1500–7800)
Neutrophils Relative %: 59 %
Platelets: 206 10*3/uL (ref 140–400)
RBC: 4.66 10*6/uL (ref 4.20–5.80)
RDW: 12.9 % (ref 11.0–15.0)
Total Lymphocyte: 31.9 %
WBC: 4.5 10*3/uL (ref 3.8–10.8)

## 2021-07-29 LAB — CK: Total CK: 399 U/L — ABNORMAL HIGH (ref 44–196)

## 2021-08-04 ENCOUNTER — Other Ambulatory Visit: Payer: Self-pay

## 2021-08-04 DIAGNOSIS — E860 Dehydration: Secondary | ICD-10-CM

## 2021-08-04 DIAGNOSIS — R748 Abnormal levels of other serum enzymes: Secondary | ICD-10-CM

## 2021-08-19 DIAGNOSIS — E860 Dehydration: Secondary | ICD-10-CM | POA: Diagnosis not present

## 2021-08-19 DIAGNOSIS — R748 Abnormal levels of other serum enzymes: Secondary | ICD-10-CM | POA: Diagnosis not present

## 2021-08-20 LAB — CK: Total CK: 525 U/L — ABNORMAL HIGH (ref 44–196)

## 2021-08-22 ENCOUNTER — Other Ambulatory Visit: Payer: Self-pay | Admitting: Nurse Practitioner

## 2021-08-22 DIAGNOSIS — R748 Abnormal levels of other serum enzymes: Secondary | ICD-10-CM

## 2021-09-30 DIAGNOSIS — R748 Abnormal levels of other serum enzymes: Secondary | ICD-10-CM | POA: Diagnosis not present

## 2021-10-08 DIAGNOSIS — U071 COVID-19: Secondary | ICD-10-CM | POA: Diagnosis not present

## 2021-10-08 DIAGNOSIS — R059 Cough, unspecified: Secondary | ICD-10-CM | POA: Diagnosis not present

## 2021-10-08 DIAGNOSIS — Z20822 Contact with and (suspected) exposure to covid-19: Secondary | ICD-10-CM | POA: Diagnosis not present

## 2021-12-01 NOTE — Patient Instructions (Signed)

## 2021-12-01 NOTE — Progress Notes (Signed)
Name: Jeffrey Harding   MRN: [id number]    DOB: [birth year]   Date:12/02/2021 ? ?     Progress Note ? ?Subjective ? ?Chief Complaint ? ?Annual Exam ? ?HPI ? ?Patient presents for annual CPE. ? ?IPSS Questionnaire (AUA-7): ?Over the past month?   ?1)  How often have you had a sensation of not emptying your bladder completely after you finish urinating?  0 - Not at all  ?2)  How often have you had to urinate again less than two hours after you finished urinating? 1 - Less than 1 time in 5  ?3)  How often have you found you stopped and started again several times when you urinated?  0 - Not at all  ?4) How difficult have you found it to postpone urination?  0 - Not at all  ?5) How often have you had a weak urinary stream?  0 - Not at all  ?6) How often have you had to push or strain to begin urination?  0 - Not at all  ?7) How many times did you most typically get up to urinate from the time you went to bed until the time you got up in the morning?  1 - 1 time  ?Total score:  0-7 mildly symptomatic  ? 8-19 moderately symptomatic  ? 20-35 severely symptomatic  ?  ? 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 ?Depression: phq 9 is negative ?Depression screen [facility] 2/9 12/02/2021 07/28/2021 11/26/2020 09/11/2019 09/11/2019  ?Decreased Interest 0 0 0 0 0  ?Down, Depressed, Hopeless 0 0 0 0 0  ?PHQ - 2 Score 0 0 0 0 0  ?Altered sleeping 0 - - 3 -  ?Tired, decreased energy 0 - - 0 -  ?Change in appetite 0 - - 0 -  ?Feeling bad or failure about yourself  0 - - 0 -  ?Trouble concentrating 0 - - 0 -  ?Moving slowly or fidgety/restless 0 - - 0 -  ?Suicidal thoughts 0 - - 0 -  ?PHQ-9 Score 0 - - 3 -  ?Difficult doing work/chores - - - Not difficult at all -  ? ? ?Hypertension:  ?BP Readings from Last 3 Encounters:  ?12/02/21 116/78  ?07/28/21 110/88  ?03/12/21 (!) 146/99   ? ? ?Obesity: ?Wt Readings from Last 3 Encounters:  ?12/02/21 175 lb (79.4 kg)  ?07/28/21 171 lb 4.8 oz (77.7 kg)  ?03/12/21 185 lb (83.9 kg)  ? ?BMI Readings from Last 3 Encounters:  ?12/02/21 25.84 kg/m?  ?07/28/21 25.30 kg/m?  ?03/12/21 27.32 kg/m?  ?  ? ?Lipids:  ?Lab Results  ?Component Value Date  ? CHOL 185 11/26/2020  ? CHOL 111 09/11/2019  ? CHOL 106 07/19/2018  ? ?Lab Results  ?Component Value Date  ? HDL 41 11/26/2020  ? HDL 39 (L) 09/11/2019  ? HDL 38 (L) 07/19/2018  ? ?Lab Results  ?Component Value Date  ? LDLCALC 124 (H) 11/26/2020  ? [place] 56 09/11/2019  ? [place] 55 07/19/2018  ? ?Lab Results  ?Component Value Date  ? TRIG 92 11/26/2020  ? TRIG 75 09/11/2019  ? TRIG 57 07/19/2018  ? ?Lab Results  ?Component Value Date  ? CHOLHDL 4.5 11/26/2020  ? CHOLHDL 2.8 09/11/2019  ? CHOLHDL 2.8 07/19/2018  ? ?  No results found for: LDLDIRECT ?Glucose:  ?Glucose, Bld  ?Date Value Ref Range Status  ?07/28/2021 102 (H) 65 - 99 mg/dL Final  ?  Comment:  ?  . ?           Fasting reference interval ?. ?For someone without known diabetes, a glucose value ?between 100 and 125 mg/dL is consistent with ?prediabetes and should be confirmed with a ?follow-up test. ?. ?  ?11/26/2020 100 (H) 65 - 99 mg/dL Final  ?  Comment:  ?  . ?           Fasting reference interval ?. ?For someone without known diabetes, a glucose value ?between 100 and 125 mg/dL is consistent with ?prediabetes and should be confirmed with a ?follow-up test. ?. ?  ?09/11/2019 105 (H) 65 - 99 mg/dL Final  ?  Comment:  ?  . ?           Fasting reference interval ?. ?For someone without known diabetes, a glucose value ?between 100 and 125 mg/dL is consistent with ?prediabetes and should be confirmed with a ?follow-up test. ?. ?  ? ? ?[place] Office Visit from 07/28/2021 in [facility]  ?AUDIT-C Score 0  ? ?  ? ? ?Divorced ?STD testing and prevention (HIV/chl/gon/syphilis):  N/A ?Hep C Screening: 06/12/14 ?Skin cancer: Discussed  monitoring for atypical lesions ?Colorectal cancer: 07/31/11 ?Prostate cancer:   ?Lab Results  ?Component Value Date  ? PSA <0.04 11/26/2020  ? ? ? ?Lung cancer:  Low Dose CT Chest recommended if Age 12-80 years, 30 pack-year currently smoking OR have quit w/in 15years. Patient  no ?AAA: The USPSTF recommends one-time screening with ultrasonography in men ages 36 to 45 years who have ever smoked. Patient:  no ?ECG:  08/15/15 ? ?Vaccines:  ? ?HPV: N/A ?Tdap: up to date ?Shingrix: up to date ?Pneumonia: N/A ?Flu: declined  ?COVID-19: discussed booster  ? ?Advanced Care Planning: A voluntary discussion about advance care planning including the explanation and discussion of advance directives.  Discussed health care proxy and Living will, and the patient was able to identify a health care proxy as daughter .  Patient does not have a living will or power of attorney of health care  ? ?Patient Active Problem List  ? Diagnosis Date Noted  ? Perennial allergic rhinitis with seasonal variation 06/18/2015  ? H/O malignant neoplasm of prostate 06/18/2015  ? History of prostatectomy 06/18/2015  ? Vitamin D deficiency 06/18/2015  ? Dyslipidemia 06/18/2015  ? ED (erectile dysfunction) of organic origin 05/23/2012  ? ? ?Past Surgical History:  ?Procedure Laterality Date  ? CYST EXCISION  2009  ? back  ? LASER OF PROSTATE W/ GREEN LIGHT PVP  2008  ? PENILE PROSTHESIS IMPLANT    ? PROSTATECTOMY    ? ? ?Family History  ?Problem Relation Age of Onset  ? Hypertension Mother   ? Hyperlipidemia Mother   ? Heart disease Mother   ? Diabetes Mother   ? Heart disease Father   ? Hyperlipidemia Father   ? Hypertension Father   ? Cancer Father   ?     prostate  ? Hypertension Sister   ? Hyperlipidemia Sister   ? Lupus Sister   ? Hypertension Sister   ? ? ?Social History  ? ?Socioeconomic History  ? Marital status: Divorced  ?  Spouse name: Not on file  ? Number of children: 1  ? Years of education: 11  ? Highest education level:  High school  graduate  ?Occupational History  ? Occupation: truck Geophysicist/field seismologist  ?Tobacco Use  ? Smoking status: Never  ? Smokeless tobacco: Never  ?Vaping Use  ? Vaping Use: Never used  ?Substance and Sexual Activity  ? Alcohol use: Yes  ?  Alcohol/week: 0.0 standard drinks  ?  Comment: rarely  ? Drug use: No  ? Sexual activity: Not Currently  ?  Partners: Female  ?Other Topics Concern  ? Not on file  ?Social History Narrative  ? 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 ?Social Determinants of Health  ? ?Financial Resource Strain: Low Risk   ? Difficulty of Paying Living Expenses: Not hard at all  ?Food Insecurity: No Food Insecurity  ? Worried About Charity fundraiser in the Last Year: Never true  ? Ran Out of Food in the Last Year: Never true  ?Transportation Needs: No Transportation Needs  ? Lack of Transportation (Medical): No  ? Lack of Transportation (Non-Medical): No  ?Physical Activity: Insufficiently Active  ? Days of Exercise per Week: 3 days  ? Minutes of Exercise per Session: 40 min  ?Stress: No Stress Concern Present  ? Feeling of Stress : Not at all  ?Social Connections: Moderately Integrated  ? Frequency of Communication with Friends and Family: Twice a week  ? Frequency of Social Gatherings with Friends and Family: Once a week  ? Attends Religious Services: More than 4 times per year  ? Active Member of Clubs or Organizations: Yes  ? Attends Archivist Meetings: More than 4 times per year  ? Marital Status: Divorced  ?Intimate Partner Violence: Not At Risk  ? Fear of Current or Ex-Partner: No  ? Emotionally Abused: No  ? Physically Abused: No  ? Sexually Abused: No  ? ? ? ?Current Outpatient Medications:  ?  Ascorbic Acid (VITAMIN C) 100 MG tablet, Take 1 tablet (100 mg total) by mouth daily., Disp: 30 tablet, Rfl: 0 ?  cholecalciferol (VITAMIN D) 1000 units tablet, Take 1,000 Units by mouth daily., Disp: , Rfl:  ? ?No Known Allergies ? ? ?ROS ? ?Constitutional:  Negative for fever or weight change.  ?Respiratory: Negative for cough and shortness of breath. Nasal congestion   ?Cardiovascular: Negative for chest pain or palpitations.  ?Gastrointestinal: Negative for abdominal

## 2021-12-02 ENCOUNTER — Encounter: Payer: Self-pay | Admitting: Family Medicine

## 2021-12-02 ENCOUNTER — Ambulatory Visit (INDEPENDENT_AMBULATORY_CARE_PROVIDER_SITE_OTHER): Payer: BC Managed Care – PPO | Admitting: Family Medicine

## 2021-12-02 ENCOUNTER — Telehealth: Payer: Self-pay

## 2021-12-02 VITALS — BP 116/78 | HR 86 | Resp 16 | Ht 69.0 in | Wt 175.0 lb

## 2021-12-02 DIAGNOSIS — Z131 Encounter for screening for diabetes mellitus: Secondary | ICD-10-CM | POA: Diagnosis not present

## 2021-12-02 DIAGNOSIS — E785 Hyperlipidemia, unspecified: Secondary | ICD-10-CM | POA: Diagnosis not present

## 2021-12-02 DIAGNOSIS — R748 Abnormal levels of other serum enzymes: Secondary | ICD-10-CM | POA: Insufficient documentation

## 2021-12-02 DIAGNOSIS — Z8546 Personal history of malignant neoplasm of prostate: Secondary | ICD-10-CM

## 2021-12-02 DIAGNOSIS — Z0000 Encounter for general adult medical examination without abnormal findings: Secondary | ICD-10-CM

## 2021-12-02 DIAGNOSIS — Z1211 Encounter for screening for malignant neoplasm of colon: Secondary | ICD-10-CM | POA: Diagnosis not present

## 2021-12-02 DIAGNOSIS — D72829 Elevated white blood cell count, unspecified: Secondary | ICD-10-CM | POA: Diagnosis not present

## 2021-12-02 NOTE — Telephone Encounter (Signed)
Pt left message returning call  ?

## 2021-12-02 NOTE — Telephone Encounter (Signed)
CALLED PATIENT NO ANSWER LEFT VOICEMAIL FOR A CALL BACK ? ?

## 2021-12-03 LAB — CBC WITH DIFFERENTIAL/PLATELET
Absolute Monocytes: 412 cells/uL (ref 200–950)
Basophils Absolute: 29 cells/uL (ref 0–200)
Basophils Relative: 0.6 %
Eosinophils Absolute: 59 cells/uL (ref 15–500)
Eosinophils Relative: 1.2 %
HCT: 43.4 % (ref 38.5–50.0)
Hemoglobin: 14.4 g/dL (ref 13.2–17.1)
Lymphs Abs: 1553 cells/uL (ref 850–3900)
MCH: 30.6 pg (ref 27.0–33.0)
MCHC: 33.2 g/dL (ref 32.0–36.0)
MCV: 92.1 fL (ref 80.0–100.0)
MPV: 10.9 fL (ref 7.5–12.5)
Monocytes Relative: 8.4 %
Neutro Abs: 2847 cells/uL (ref 1500–7800)
Neutrophils Relative %: 58.1 %
Platelets: 193 10*3/uL (ref 140–400)
RBC: 4.71 10*6/uL (ref 4.20–5.80)
RDW: 12.9 % (ref 11.0–15.0)
Total Lymphocyte: 31.7 %
WBC: 4.9 10*3/uL (ref 3.8–10.8)

## 2021-12-03 LAB — COMPLETE METABOLIC PANEL WITH GFR
AG Ratio: 1.7 (calc) (ref 1.0–2.5)
ALT: 28 U/L (ref 9–46)
AST: 26 U/L (ref 10–35)
Albumin: 4.3 g/dL (ref 3.6–5.1)
Alkaline phosphatase (APISO): 47 U/L (ref 35–144)
BUN: 18 mg/dL (ref 7–25)
CO2: 27 mmol/L (ref 20–32)
Calcium: 8.8 mg/dL (ref 8.6–10.3)
Chloride: 106 mmol/L (ref 98–110)
Creat: 1.03 mg/dL (ref 0.70–1.35)
Globulin: 2.6 g/dL (calc) (ref 1.9–3.7)
Glucose, Bld: 92 mg/dL (ref 65–99)
Potassium: 4.1 mmol/L (ref 3.5–5.3)
Sodium: 140 mmol/L (ref 135–146)
Total Bilirubin: 0.7 mg/dL (ref 0.2–1.2)
Total Protein: 6.9 g/dL (ref 6.1–8.1)
eGFR: 83 mL/min/{1.73_m2} (ref >=60)

## 2021-12-03 LAB — LIPID PANEL
Cholesterol: 168 mg/dL (ref <200)
HDL: 45 mg/dL (ref >=40)
LDL Cholesterol (Calc): 107 mg/dL (calc) — ABNORMAL HIGH
Non-HDL Cholesterol (Calc): 123 mg/dL (calc) (ref <130)
Total CHOL/HDL Ratio: 3.7 (calc) (ref <5.0)
Triglycerides: 72 mg/dL (ref <150)

## 2021-12-03 LAB — HEMOGLOBIN A1C
Hgb A1c MFr Bld: 5.7 % of total Hgb — ABNORMAL HIGH (ref <5.7)
Mean Plasma Glucose: 117 mg/dL
eAG (mmol/L): 6.5 mmol/L

## 2021-12-03 LAB — PSA: PSA: <0.04 ng/mL (ref <=4.00)

## 2021-12-05 ENCOUNTER — Other Ambulatory Visit: Payer: Self-pay

## 2021-12-05 DIAGNOSIS — Z1211 Encounter for screening for malignant neoplasm of colon: Secondary | ICD-10-CM

## 2021-12-05 MED ORDER — NA SULFATE-K SULFATE-MG SULF 17.5-3.13-1.6 GM/177ML PO SOLN: 1.0000 | Freq: Once | ORAL | 0 refills | Status: AC

## 2021-12-05 NOTE — Progress Notes (Signed)
Gastroenterology Pre-Procedure Review ? ?Request Date: 01/13/2022 ?Requesting Physician: Dr. Vicente Males ? ?PATIENT REVIEW QUESTIONS: The patient responded to the following health history questions as indicated:   ? ?1. Are you having any GI issues? no ?2. Do you have a personal history of Polyps? no ?3. Do you have a family history of Colon Cancer or Polyps? no ?4. Diabetes Mellitus? no ?5. Joint replacements in the past 12 months?no ?6. Major health problems in the past 3 months?no ?7. Any artificial heart valves, MVP, or defibrillator?no ?   ?MEDICATIONS & ALLERGIES:    ?Patient reports the following regarding taking any anticoagulation/antiplatelet therapy:   ?Plavix, Coumadin, Eliquis, Xarelto, Lovenox, Pradaxa, Brilinta, or Effient? no ?Aspirin? no ? ?Patient confirms/reports the following medications:  ?Current Outpatient Medications  ?Medication Sig Dispense Refill  ? Ascorbic Acid (VITAMIN C) 100 MG tablet Take 1 tablet (100 mg total) by mouth daily. 30 tablet 0  ? cholecalciferol (VITAMIN D) 1000 units tablet Take 1,000 Units by mouth daily.    ? ?No current facility-administered medications for this visit.  ? ? ?Patient confirms/reports the following allergies:  ?No Known Allergies ? ?No orders of the defined types were placed in this encounter. ? ? ?AUTHORIZATION INFORMATION ?Primary Insurance: ?1D#: ?Group #: ? ?Secondary Insurance: ?1D#: ?Group #: ? ?SCHEDULE INFORMATION: ?Date: 01/13/2022 ?Time: ?[place] ? ?

## 2021-12-30 DIAGNOSIS — R748 Abnormal levels of other serum enzymes: Secondary | ICD-10-CM | POA: Diagnosis not present

## 2022-01-09 ENCOUNTER — Encounter: Payer: Self-pay | Admitting: Gastroenterology

## 2022-01-09 ENCOUNTER — Other Ambulatory Visit: Payer: Self-pay

## 2022-01-13 ENCOUNTER — Encounter: Payer: Self-pay | Admitting: Gastroenterology

## 2022-01-13 ENCOUNTER — Ambulatory Visit: Payer: BC Managed Care – PPO | Admitting: Anesthesiology

## 2022-01-13 ENCOUNTER — Ambulatory Visit
Admission: RE | Admit: 2022-01-13 | Discharge: 2022-01-13 | Disposition: A | Discharge status: Home / Self Care | Payer: BC Managed Care – PPO | Attending: Gastroenterology | Admitting: Gastroenterology

## 2022-01-13 ENCOUNTER — Encounter
Admission: RE | Disposition: A | Discharge status: Home / Self Care | Payer: Self-pay | Source: Home / Self Care | Attending: Gastroenterology

## 2022-01-13 ENCOUNTER — Other Ambulatory Visit: Payer: Self-pay

## 2022-01-13 DIAGNOSIS — E559 Vitamin D deficiency, unspecified: Secondary | ICD-10-CM | POA: Insufficient documentation

## 2022-01-13 DIAGNOSIS — D122 Benign neoplasm of ascending colon: Secondary | ICD-10-CM | POA: Insufficient documentation

## 2022-01-13 DIAGNOSIS — Z1211 Encounter for screening for malignant neoplasm of colon: Secondary | ICD-10-CM | POA: Insufficient documentation

## 2022-01-13 DIAGNOSIS — K621 Rectal polyp: Secondary | ICD-10-CM | POA: Diagnosis not present

## 2022-01-13 DIAGNOSIS — K635 Polyp of colon: Secondary | ICD-10-CM

## 2022-01-13 HISTORY — PX: COLONOSCOPY WITH PROPOFOL: SHX5780

## 2022-01-13 SURGERY — COLONOSCOPY WITH PROPOFOL
Anesthesia: General

## 2022-01-13 MED ORDER — LIDOCAINE HCL (PF) 2 % IJ SOLN
INTRAMUSCULAR | Status: AC
  Filled 2022-01-13: qty 5

## 2022-01-13 MED ORDER — SODIUM CHLORIDE 0.9 % IV SOLN: INTRAVENOUS | Status: DC

## 2022-01-13 MED ORDER — PROPOFOL 500 MG/50ML IV EMUL
INTRAVENOUS | Status: DC | PRN
Start: 2022-01-13 — End: 2022-01-13
  Administered 2022-01-13: 150 ug/kg/min via INTRAVENOUS

## 2022-01-13 MED ORDER — PROPOFOL 500 MG/50ML IV EMUL
INTRAVENOUS | Status: AC
  Filled 2022-01-13: qty 50

## 2022-01-13 MED ORDER — STERILE WATER FOR IRRIGATION IR SOLN
Status: DC | PRN
  Administered 2022-01-13: 60 mL

## 2022-01-13 MED ORDER — LIDOCAINE HCL (CARDIAC) PF 100 MG/5ML IV SOSY
PREFILLED_SYRINGE | INTRAVENOUS | Status: DC | PRN
  Administered 2022-01-13: 25 mg via INTRAVENOUS

## 2022-01-13 MED ORDER — PROPOFOL 10 MG/ML IV BOLUS
INTRAVENOUS | Status: DC | PRN
  Administered 2022-01-13: 120 mg via INTRAVENOUS

## 2022-01-13 NOTE — Op Note (Signed)
[facility] ?Gastroenterology ?Patient Name: Jeffrey Harding ?Procedure Date: 01/13/2022 11:05 AM ?MRN: [id number] ?Account #: [id number] ?Date of Birth: 05/08/1961 ?Admit Type: Outpatient ?Age: 61 ?Room: [facility] ENDO ROOM 1 ?Gender: Male ?Note Status: Finalized ?Instrument Name: Colonoscope [id number] ?Procedure:             Colonoscopy ?Indications:           Screening for colorectal malignant neoplasm ?Providers:             Jonathon Bellows MD, MD ?Medicines:             Monitored Anesthesia Care ?Complications:         No immediate complications. ?Procedure:             Pre-Anesthesia Assessment: ?                       - Prior to the procedure, a History and Physical was  ?                       performed, and patient medications, allergies and  ?                       sensitivities were reviewed. The patient's tolerance  ?                       of previous anesthesia was reviewed. ?                       - The risks and benefits of the procedure and the  ?                       sedation options and risks were discussed with the  ?                       patient. All questions were answered and informed  ?                       consent was obtained. ?                       - ASA Grade Assessment: II - A patient with mild  ?                       systemic disease. ?                       After obtaining informed consent, the colonoscope was  ?                       passed under direct vision. Throughout the procedure,  ?                       the patient's blood pressure, pulse, and oxygen  ?                       saturations were monitored continuously. The  ?                       Colonoscope was introduced through the anus and  ?  advanced to the the cecum, identified by the  ?                       appendiceal orifice. The colonoscopy was performed  ?                       with ease. The patient tolerated the procedure well.  ?                       The quality of the bowel preparation  was excellent. ?Findings: ?     The perianal and digital rectal examinations were normal. ?     Three sessile polyps were found in the ascending colon. The polyps were  ?     4 to 5 mm in size. These polyps were removed with a cold snare.  ?     Resection and retrieval were complete. ?     A 5 mm polyp was found in the rectum. The polyp was sessile. The polyp  ?     was removed with a cold snare. Resection and retrieval were complete. ?     The exam was otherwise without abnormality on direct and retroflexion  ?     views. ?Impression:            - Three 4 to 5 mm polyps in the ascending colon,  ?                       removed with a cold snare. Resected and retrieved. ?                       - One 5 mm polyp in the rectum, removed with a cold  ?                       snare. Resected and retrieved. ?                       - The examination was otherwise normal on direct and  ?                       retroflexion views. ?Recommendation:        - Discharge patient to home (with escort). ?                       - Resume previous diet. ?                       - Continue present medications. ?                       - Await pathology results. ?                       - Repeat colonoscopy for surveillance based on  ?                       pathology results. ?Procedure Code(s):     --- Professional --- ?                       [phone], Colonoscopy, flexible; with removal of  ?  tumor(s), polyp(s), or other lesion(s) by snare  ?                       technique ?Diagnosis Code(s):     --- Professional --- ?                       Z12.11, Encounter for screening for malignant neoplasm  ?                       of colon ?                       K62.1, Rectal polyp ?                       K63.5, Polyp of colon ?CPT copyright 2019 American Medical Association. All rights reserved. ?The codes documented in this report are preliminary and upon coder review may  ?be revised to meet current compliance  requirements. ?Jonathon Bellows, MD ?Jonathon Bellows MD, MD ?01/13/2022 11:39:12 AM ?This report has been signed electronically. ?Number of Addenda: 0 ?Note Initiated On: 01/13/2022 11:05 AM ?Scope Withdrawal Time: 0 hours 11 minutes 16 seconds  ?Total Procedure Duration: 0 hours 18 minutes 54 seconds  ?Estimated Blood Loss:  Estimated blood loss: none. ?     [facility] ?

## 2022-01-13 NOTE — Anesthesia Postprocedure Evaluation (Signed)
Anesthesia Post Note ? ?Patient: Jeffrey Harding ? ?Procedure(s) Performed: COLONOSCOPY WITH PROPOFOL ? ?Patient location during evaluation: Endoscopy ?Anesthesia Type: General ?Level of consciousness: awake and alert ?Pain management: pain level controlled ?Vital Signs Assessment: post-procedure vital signs reviewed and stable ?Respiratory status: spontaneous breathing, nonlabored ventilation and respiratory function stable ?Cardiovascular status: blood pressure returned to baseline and stable ?Postop Assessment: no apparent nausea or vomiting ?Anesthetic complications: no ? ? ?No notable events documented. ? ? ?Last Vitals:  ?Vitals:  ? 01/13/22 1141 01/13/22 1200  ?BP: 117/73   ?Pulse:  67  ?Resp:    ?Temp: 36.8 ?C   ?SpO2:    ?  ?Last Pain:  ?Vitals:  ? 01/13/22 1200  ?TempSrc:   ?PainSc: 0-No pain  ? ? ?  ?  ?  ?  ?  ?  ? ?[place] ? ? ? ? ?

## 2022-01-13 NOTE — Anesthesia Preprocedure Evaluation (Signed)
Anesthesia Evaluation  ?Patient identified by MRN, date of birth, ID band ?Patient awake ? ? ? ?Reviewed: ?Allergy & Precautions, NPO status , Patient's Chart, lab work & pertinent test results ? ?Airway ?Mallampati: II ? ?TM Distance: >3 FB ?Neck ROM: full ? ? ? Dental ?no notable dental hx. ? ?  ?Pulmonary ?neg pulmonary ROS,  ?  ?Pulmonary exam normal ? ? ? ? ? ? ? Cardiovascular ?negative cardio ROS ?Normal cardiovascular exam ? ? ?  ?Neuro/Psych ?negative neurological ROS ? negative psych ROS  ? GI/Hepatic ?negative GI ROS, Neg liver ROS,   ?Endo/Other  ?negative endocrine ROS ? Renal/GU ?negative Renal ROS  ?negative genitourinary ?  ?Musculoskeletal ? ? Abdominal ?Normal abdominal exam  (+)   ?Peds ? Hematology ?negative hematology ROS ?(+)   ?Anesthesia Other Findings ?Past Medical History: ?No date: Allergy ?2008: Cancer [facility]) ?    Comment:  prostate ?No date: H/O prostatectomy ?No date: Hyperlipidemia ?No date: Over weight ?No date: Vitamin D deficiency ? ?Past Surgical History: ?No date: COLONOSCOPY ?09/19/2007: CYST EXCISION ?    Comment:  back ?09/18/2006: LASER OF PROSTATE W/ GREEN LIGHT PVP ?No date: PENILE PROSTHESIS IMPLANT ?No date: PROSTATECTOMY ?No date: TONSILECTOMY/ADENOIDECTOMY WITH MYRINGOTOMY ?No date: TONSILLECTOMY ? ?BMI   ? Body Mass Index: 22.89 kg/m?  ?  ? ? Reproductive/Obstetrics ?negative OB ROS ? ?  ? ? ? ? ? ? ? ? ? ? ? ? ? ?  ?  ? ? ? ? ? ? ? ? ?Anesthesia Physical ?Anesthesia Plan ? ?ASA: 2 ? ?Anesthesia Plan: General  ? ?Post-op Pain Management:   ? ?Induction: Intravenous ? ?PONV Risk Score and Plan: Propofol infusion and TIVA ? ?Airway Management Planned: Natural Airway ? ?Additional Equipment:  ? ?Intra-op Plan:  ? ?Post-operative Plan:  ? ?Informed Consent: I have reviewed the patients History and Physical, chart, labs and discussed the procedure including the risks, benefits and alternatives for the proposed anesthesia with the patient or  authorized representative who has indicated his/her understanding and acceptance.  ? ? ? ?Dental Advisory Given ? ?Plan Discussed with: Anesthesiologist, CRNA and Surgeon ? ?Anesthesia Plan Comments:   ? ? ? ? ? ? ?Anesthesia Quick Evaluation ? ?

## 2022-01-13 NOTE — Transfer of Care (Signed)
Immediate Anesthesia Transfer of Care Note ? ?Patient: Jeffrey Harding ? ?Procedure(s) Performed: COLONOSCOPY WITH PROPOFOL ? ?Patient Location: PACU and Endoscopy Unit ? ?Anesthesia Type:General ? ?Level of Consciousness: awake ? ?Airway & Oxygen Therapy: Patient Spontanous Breathing ? ?Post-op Assessment: Report given to RN ? ?Post vital signs: stable ? ?Last Vitals:  ?Vitals Value Taken Time  ?BP    ?Temp    ?Pulse 86 01/13/22 1141  ?Resp 17 01/13/22 1141  ?SpO2 98 % 01/13/22 1141  ?Vitals shown include unvalidated device data. ? ?Last Pain:  ?Vitals:  ? 01/13/22 1012  ?TempSrc: Temporal  ?PainSc: 0-No pain  ?   ? ?  ? ?Complications: No notable events documented. ?

## 2022-01-13 NOTE — H&P (Signed)
? ? ? ?Jonathon Bellows, MD ?[address] [place], [place], [place], [address] ?[address] [place], [place], [place], [address] ?Phone: [phone]  ?Fax: [phone] ? ?Primary Care Physician:  Steele Sizer, MD ? ? ?Pre-Procedure History & Physical: ?HPI:  Jeffrey Harding is a 61 y.o. male is here for an colonoscopy. ?  ?Past Medical History:  ?Diagnosis Date  ? Allergy   ? Cancer [facility]) 2008  ? prostate  ? H/O prostatectomy   ? Hyperlipidemia   ? Over weight   ? Vitamin D deficiency   ? ? ?Past Surgical History:  ?Procedure Laterality Date  ? COLONOSCOPY    ? CYST EXCISION  09/19/2007  ? back  ? LASER OF PROSTATE W/ GREEN LIGHT PVP  09/18/2006  ? PENILE PROSTHESIS IMPLANT    ? PROSTATECTOMY    ? TONSILECTOMY/ADENOIDECTOMY WITH MYRINGOTOMY    ? TONSILLECTOMY    ? ? ?Prior to Admission medications   ?Medication Sig Start Date End Date Taking? Authorizing Provider  ?Ascorbic Acid (VITAMIN C) 100 MG tablet Take 1 tablet (100 mg total) by mouth daily. 07/19/18  Yes Sowles, Drue Stager, MD  ?cholecalciferol (VITAMIN D) 1000 units tablet Take 1,000 Units by mouth daily.   Yes [provider]  ?loratadine (CLARITIN) 10 MG tablet Take 10 mg by mouth daily.   Yes [provider]  ? ? ?Allergies as of 12/05/2021  ? (No Known Allergies)  ? ? ?Family History  ?Problem Relation Age of Onset  ? Hypertension Mother   ? Hyperlipidemia Mother   ? Heart disease Mother   ? Diabetes Mother   ? Heart disease Father   ? Hyperlipidemia Father   ? Hypertension Father   ? Cancer Father   ?     prostate  ? Hypertension Sister   ? Hyperlipidemia Sister   ? Lupus Sister   ? Hypertension Sister   ? ? ?Social History  ? ?Socioeconomic History  ? Marital status: Divorced  ?  Spouse name: Not on file  ? Number of children: 1  ? Years of education: 63  ? Highest education level: High school graduate  ?Occupational History  ? Occupation: truck Geophysicist/field seismologist  ?Tobacco Use  ? Smoking status: Never  ? Smokeless tobacco: Never  ?Vaping Use   ? Vaping Use: Never used  ?Substance and Sexual Activity  ? Alcohol use: Yes  ?  Alcohol/week: 0.0 standard drinks  ?  Comment: rarely  ? Drug use: No  ? Sexual activity: Not Currently  ?  Partners: Female  ?Other Topics Concern  ? Not on file  ?Social History Narrative  ? 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 ?Social Determinants of Health  ? ?Financial Resource Strain: Low Risk   ? Difficulty of Paying Living Expenses: Not hard at all  ?Food Insecurity: No Food Insecurity  ? Worried About Charity fundraiser in the Last Year: Never true  ? Ran Out of Food in the Last Year: Never true  ?Transportation Needs: No Transportation Needs  ? Lack of Transportation (Medical): No  ? Lack of Transportation (Non-Medical): No  ?Physical Activity: Insufficiently Active  ? Days of Exercise per Week: 3 days  ? Minutes of Exercise per Session: 40 min  ?Stress: No Stress Concern Present  ? Feeling of Stress : Not at all  ?Social Connections: Moderately Integrated  ? Frequency of Communication with Friends and Family: Twice a week  ?  Frequency of Social Gatherings with Friends and Family: Once a week  ? Attends Religious Services: More than 4 times per year  ? Active Member of Clubs or Organizations: Yes  ? Attends Archivist Meetings: More than 4 times per year  ? Marital Status: Divorced  ?Intimate Partner Violence: Not At Risk  ? Fear of Current or Ex-Partner: No  ? Emotionally Abused: No  ? Physically Abused: No  ? Sexually Abused: No  ? ? ?Review of Systems: ?See HPI, otherwise negative ROS ? ?Physical Exam: ?BP (!) 125/53   Pulse 87   Temp (!) 97.1 ?F (36.2 ?C) (Temporal)   Resp 16   Ht '5\' 9"'$  (1.753 m)   Wt 70.3 kg   SpO2 97%   BMI 22.89 kg/m?  ?General:   Alert,  pleasant and cooperative in NAD ?Head:  Normocephalic and atraumatic. ?Neck:  Supple; no masses or thyromegaly. ?Lungs:  Clear throughout to auscultation, normal respiratory effort.    ?Heart:  +S1, +S2,  Regular rate and rhythm, No edema. ?Abdomen:  Soft, nontender and nondistended. Normal bowel sounds, without guarding, and without rebound.   ?Neurologic:  Alert and  oriented x4;  grossly normal neurologically. ? ?Impression/Plan: ?Jeffrey Harding is here for an colonoscopy to be performed for Screening colonoscopy average risk   ?Risks, benefits, limitations, and alternatives regarding  colonoscopy have been reviewed with the patient.  Questions have been answered.  All parties agreeable. ? ? ?Jonathon Bellows, MD  01/13/2022, 11:06 AM ? ?

## 2022-01-16 ENCOUNTER — Encounter: Payer: Self-pay | Admitting: Gastroenterology

## 2022-01-16 LAB — SURGICAL PATHOLOGY

## 2022-01-17 ENCOUNTER — Encounter: Payer: Self-pay | Admitting: Gastroenterology

## 2022-01-27 ENCOUNTER — Ambulatory Visit (INDEPENDENT_AMBULATORY_CARE_PROVIDER_SITE_OTHER): Payer: BC Managed Care – PPO | Admitting: Nurse Practitioner

## 2022-01-27 ENCOUNTER — Other Ambulatory Visit: Payer: Self-pay

## 2022-01-27 ENCOUNTER — Encounter: Payer: Self-pay | Admitting: Nurse Practitioner

## 2022-01-27 VITALS — BP 110/68 | HR 92 | Temp 98.2°F | Resp 18 | Ht 69.0 in | Wt 175.0 lb

## 2022-01-27 DIAGNOSIS — M25561 Pain in right knee: Secondary | ICD-10-CM

## 2022-01-27 NOTE — Progress Notes (Signed)
? ?BP 110/68   Pulse 92   Temp 98.2 ?F (36.8 ?C) (Oral)   Resp 18   Ht '5\' 9"'$  (1.753 m)   Wt 175 lb (79.4 kg)   SpO2 98%   BMI 25.84 kg/m?   ? ?Subjective:  ? ? Patient ID: Jeffrey Harding, male    DOB: [birth year], 61 y.o.   MRN: [id number] ? ?HPI: ?Jeffrey Harding is a 61 y.o. male ? ?Chief Complaint  ?Patient presents with  ? Leg Pain  ?  Right knee pain  ? ?Right knee pain: He says that he has been having right knee pain for a couple of weeks.  No known injury, but it hurts when he bends it. No swelling, no heat when touched. No tenderness to palpation.  Denies calf pain denies thigh pain.  Denies pain on palpation.  He says it only hurts when he moves it.  Does have decreased range of motion.  He says he did take 1 Tylenol yesterday to help with the pain.  Discussed continue taking Tylenol for pain.  Discussed trying Voltaren gel.  Referral placed for [facility].  Discussed with patient that if he gets any worse and he does not want a wait for the referral he can do a walk-in.  ? ?Patient does have an appointment with his rheumatologist next Friday. ? ?Relevant past medical, surgical, family and social history reviewed and updated as indicated. Interim medical history since our last visit reviewed. ?Allergies and medications reviewed and updated. ? ?Review of Systems ? ?Constitutional: Negative for fever or weight change.  ?Respiratory: Negative for cough and shortness of breath.   ?Cardiovascular: Negative for chest pain or palpitations.  ?Gastrointestinal: Negative for abdominal pain, no bowel changes.  ?Musculoskeletal: Negative for gait problem or joint swelling.  Positive for right knee pain ?Skin: Negative for rash.  ?Neurological: Negative for dizziness or headache.  ?No other specific complaints in a complete review of systems (except as listed in HPI above).  ? ?   ?Objective:  ?  ?BP 110/68   Pulse 92   Temp 98.2 ?F (36.8 ?C) (Oral)   Resp 18   Ht '5\' 9"'$  (1.753 m)   Wt 175 lb (79.4 kg)   SpO2  98%   BMI 25.84 kg/m?   ?Wt Readings from Last 3 Encounters:  ?01/27/22 175 lb (79.4 kg)  ?01/09/22 155 lb (70.3 kg)  ?12/02/21 175 lb (79.4 kg)  ?  ?Physical Exam ? ?Constitutional: Patient appears well-developed and well-nourished. Overweight No distress.  ?HEENT: head atraumatic, normocephalic, pupils equal and reactive to light, neck supple ?Cardiovascular: Normal rate, regular rhythm and normal heart sounds.  No murmur heard. No BLE edema. ?Pulmonary/Chest: Effort normal and breath sounds normal. No respiratory distress. ?Abdominal: Soft.  There is no tenderness. ?MSK: No tenderness, no swelling no redness no signs of infection ?Psychiatric: Patient has a normal mood and affect. behavior is normal. Judgment and thought content normal.  ? ?Results for orders placed or performed during the hospital encounter of 01/13/22  ?Surgical pathology  ?Result Value Ref Range  ? SURGICAL PATHOLOGY    ?  SURGICAL PATHOLOGY ?CASE: [id number] ?PATIENT: Dravyn Polo ?Surgical Pathology Report ? ? ? ? ?Specimen Submitted: ?A. Colon polyp x3, ascending; cold snare ?B. Rectum polyp; cold snare ? ?Clinical History: Colon cancer screening. Finding: Colon polyps ? ? ? ? ?DIAGNOSIS: ?A.  COLON POLYP X 3, ASCENDING; COLD SNARE: ?- TUBULAR ADENOMA, MULTIPLE FRAGMENTS. ?- NEGATIVE FOR HIGH-GRADE DYSPLASIA AND MALIGNANCY. ? ?  B.  RECTUM POLYP; COLD SNARE: ?- HYPERPLASTIC POLYP. ?- NEGATIVE FOR DYSPLASIA AND MALIGNANCY. ? ?GROSS DESCRIPTION: ?A. Labeled: Ascending colon polyp cold snare x3 ?Received: Formalin ?Collection time: 11:21 AM on 01/13/2022 ?Placed into formalin time: 11:21 AM on 01/13/2022 ?Tissue fragment(s): Multiple ?Size: Aggregate, 1.5 x 0.4 x 0.1 cm ?Description: Tan soft tissue fragments ?Entirely submitted in 1 cassette. ? ?B. Labeled: Rectum polyp cold snare ?Received: Formalin ?Collection time: 11:34 AM on 01/13/2022 ?Placed into formalin time: 11:34 AM on 01/13/2022 ?Tissue fragment( s): Multiple ?Size: Aggregate, 0.7 x  0.2 x 0.1 cm ?Description: Received are tan soft tissue fragments, admixed with ?intestinal debris.  The ratio of soft tissue to intestinal debris is 90: ?10. ?Entirely submitted in 1 cassette. ? ?CM 01/13/2022 ? ?Final Diagnosis performed by Bryan Lemma, MD.   Electronically signed ?01/16/2022 4:20:34PM ?The electronic signature indicates that the named Attending Pathologist ?has evaluated the specimen ?Technical component performed at [facility], [address], [place], ?[place] [address] Lab: [phone] Dir: Rush Farmer, MD, MMM ? Professional component performed at [facility], [facility], [place], [place], [place] [address] Lab: [phone] ?Dir: Kathi Simpers, MD ?  ? ?   ?Assessment & Plan:  ? ?Problem List Items Addressed This Visit   ?None ?Visit Diagnoses   ? ? Acute pain of right knee    -  Primary  ? Decreased range of motion no tenderness or swelling noted.  Referral to Ortho.  Continue taking Tylenol for pain.  ? Relevant Orders  ? Ambulatory referral to Orthopedic Surgery  ? ?  ?  ? ?Follow up plan: ?No follow-ups on file. ? ? ? ? ? ?

## 2022-02-03 DIAGNOSIS — M2241 Chondromalacia patellae, right knee: Secondary | ICD-10-CM | POA: Diagnosis not present

## 2022-02-03 DIAGNOSIS — R748 Abnormal levels of other serum enzymes: Secondary | ICD-10-CM | POA: Diagnosis not present

## 2022-03-04 DIAGNOSIS — M62838 Other muscle spasm: Secondary | ICD-10-CM | POA: Diagnosis not present

## 2022-03-04 DIAGNOSIS — S39012A Strain of muscle, fascia and tendon of lower back, initial encounter: Secondary | ICD-10-CM | POA: Diagnosis not present

## 2022-04-16 DIAGNOSIS — J329 Chronic sinusitis, unspecified: Secondary | ICD-10-CM | POA: Diagnosis not present

## 2022-06-08 NOTE — Progress Notes (Signed)
Name: Jeffrey Harding   MRN: [id number]    DOB: 03/25/1961   Date:06/09/2022       Progress Note  Subjective  Chief Complaint  Follow Up  HPI  Dyslipidemia: he was on Atorvastatin for many years, however last time his LFT"s spiked, he had a change in insurance and was seen at [facility] early 2021, had multiple tests and ferritin was high, he was advised to stay off statin therapy, LFT's improved, but CK has remained elevated. He is now seeing Dr. Posey Pronto had multiple labs done and is following up with him every 6 months, Dr. Posey Pronto thinks it is a physiological elevation   The 10-year ASCVD risk score (Arnett DK, et al., 2019) is: 8.7%   Values used to calculate the score:     Age: 61 years     Sex: Male     Is Non-Hispanic African American: Yes     Diabetic: No     Tobacco smoker: No     Systolic Blood Pressure: 235 mmHg     Is BP treated: No     HDL Cholesterol: 45 mg/dL     Total Cholesterol: 168 mg/dL    Vitamin D def: he is taking otc supplementation   Pre-diabetes: he was following a low carb diet and had lost weight, it was down to 171 lbs,he is eating bread again and weight is back to baseline  at 185 lbs   GERD: he states unable to sleep flat because it makes him choke , symptoms started about 5 months ago. Denies heart burn or indigestion. He states not improving with claritin or flonase.    Patient Active Problem List   Diagnosis Date Noted   Elevated CK 12/02/2021   Perennial allergic rhinitis with seasonal variation 06/18/2015   H/O malignant neoplasm of prostate 06/18/2015   History of prostatectomy 06/18/2015   Vitamin D deficiency 06/18/2015   Dyslipidemia 06/18/2015   ED (erectile dysfunction) of organic origin 05/23/2012    Past Surgical History:  Procedure Laterality Date   COLONOSCOPY     COLONOSCOPY WITH PROPOFOL N/A 01/13/2022   Procedure: COLONOSCOPY WITH PROPOFOL;  Surgeon: Jonathon Bellows, MD;  Location: [facility] ENDOSCOPY;  Service: Gastroenterology;  Laterality:  N/A;   CYST EXCISION  09/19/2007   back   LASER OF PROSTATE W/ GREEN LIGHT PVP  09/18/2006   PENILE PROSTHESIS IMPLANT     PROSTATECTOMY     TONSILECTOMY/ADENOIDECTOMY WITH MYRINGOTOMY      Family History  Problem Relation Age of Onset   Hypertension Mother    Hyperlipidemia Mother    Heart disease Mother    Diabetes Mother    Heart disease Father    Hyperlipidemia Father    Hypertension Father    Cancer Father        prostate   Hypertension Sister    Hyperlipidemia Sister    Lupus Sister    Hypertension Sister     Social History   Tobacco Use   Smoking status: Never   Smokeless tobacco: Never  Substance Use Topics   Alcohol use: Yes    Alcohol/week: 0.0 standard drinks of alcohol    Comment: rarely     Current Outpatient Medications:    Ascorbic Acid (VITAMIN C) 100 MG tablet, Take 1 tablet (100 mg total) by mouth daily., Disp: 30 tablet, Rfl: 0   cholecalciferol (VITAMIN D) 1000 units tablet, Take 1,000 Units by mouth daily., Disp: , Rfl:   No Known Allergies  I personally  reviewed active problem list, medication list, allergies, family history, social history, health maintenance with the patient/caregiver today.   ROS  Constitutional: Negative for fever or weight change.  Respiratory: Negative for cough and shortness of breath.   Cardiovascular: Negative for chest pain or palpitations.  Gastrointestinal: Negative for abdominal pain, no bowel changes.  Musculoskeletal: Negative for gait problem or joint swelling.  Skin: Negative for rash.  Neurological: Negative for dizziness or headache.  No other specific complaints in a complete review of systems (except as listed in HPI above).   Objective  Vitals:   06/09/22 0751  BP: 128/80  Pulse: 87  Resp: 16  SpO2: 98%  Weight: 183 lb (83 kg)  Height: '5\' 9"'$  (1.753 m)    Body mass index is 27.02 kg/m.  Physical Exam  Constitutional: Patient appears well-developed and well-nourished. No distress.   HEENT: head atraumatic, normocephalic, pupils equal and reactive to light, neck supple Cardiovascular: Normal rate, regular rhythm and normal heart sounds.  No murmur heard. No BLE edema. Pulmonary/Chest: Effort normal and breath sounds normal. No respiratory distress. Abdominal: Soft.  There is no tenderness. Psychiatric: Patient has a normal mood and affect. behavior is normal. Judgment and thought content normal.   PHQ2/9:    06/09/2022    7:44 AM 01/27/2022    1:51 PM 12/02/2021    7:41 AM 07/28/2021    8:10 AM 11/26/2020    8:06 AM  Depression screen PHQ 2/9  Decreased Interest 0 0 0 0 0  Down, Depressed, Hopeless 0 0 0 0 0  PHQ - 2 Score 0 0 0 0 0  Altered sleeping 0  0    Tired, decreased energy 0  0    Change in appetite 0  0    Feeling bad or failure about yourself  0  0    Trouble concentrating 0  0    Moving slowly or fidgety/restless 0  0    Suicidal thoughts 0  0    PHQ-9 Score 0  0      phq 9 is negative   Fall Risk:    06/09/2022    7:44 AM 01/27/2022    1:51 PM 12/02/2021    7:41 AM 07/28/2021    8:09 AM 11/26/2020    8:06 AM  Fall Risk   Falls in the past year? 1 0 0 1 0  Number falls in past yr: 0 0 0 1 0  Injury with Fall? 0 0 0 0 0  Risk for fall due to : No Fall Risks  No Fall Risks History of fall(s)   Follow up Falls prevention discussed Falls evaluation completed Falls prevention discussed Falls evaluation completed       Functional Status Survey: Is the patient deaf or have difficulty hearing?: No Does the patient have difficulty seeing, even when wearing glasses/contacts?: No Does the patient have difficulty concentrating, remembering, or making decisions?: No Does the patient have difficulty walking or climbing stairs?: No Does the patient have difficulty dressing or bathing?: No Does the patient have difficulty doing errands alone such as visiting a doctor's office or shopping?: No    Assessment & Plan  1. Elevated CK  Seeing Dr.  Posey Pronto , denies muscle aches or weakness   2. Vitamin D deficiency  Continue vitamin D supplementation  3. H/O malignant neoplasm of prostate   4. Dyslipidemia  On diet only  5. History of prostatectomy   6. Pre-diabetes   Discussed low carb  diet    7. GERD without esophagitis  We will try GERD diet, and PPI, return sooner if needed

## 2022-06-09 ENCOUNTER — Other Ambulatory Visit: Payer: Self-pay | Admitting: Family Medicine

## 2022-06-09 ENCOUNTER — Encounter: Payer: Self-pay | Admitting: Family Medicine

## 2022-06-09 ENCOUNTER — Ambulatory Visit (INDEPENDENT_AMBULATORY_CARE_PROVIDER_SITE_OTHER): Payer: BC Managed Care – PPO | Admitting: Family Medicine

## 2022-06-09 VITALS — BP 128/80 | HR 87 | Resp 16 | Ht 69.0 in | Wt 183.0 lb

## 2022-06-09 DIAGNOSIS — E785 Hyperlipidemia, unspecified: Secondary | ICD-10-CM

## 2022-06-09 DIAGNOSIS — E559 Vitamin D deficiency, unspecified: Secondary | ICD-10-CM | POA: Diagnosis not present

## 2022-06-09 DIAGNOSIS — Z9079 Acquired absence of other genital organ(s): Secondary | ICD-10-CM

## 2022-06-09 DIAGNOSIS — K219 Gastro-esophageal reflux disease without esophagitis: Secondary | ICD-10-CM

## 2022-06-09 DIAGNOSIS — R7303 Prediabetes: Secondary | ICD-10-CM

## 2022-06-09 DIAGNOSIS — Z8546 Personal history of malignant neoplasm of prostate: Secondary | ICD-10-CM

## 2022-06-09 DIAGNOSIS — R748 Abnormal levels of other serum enzymes: Secondary | ICD-10-CM

## 2022-06-09 MED ORDER — OMEPRAZOLE 40 MG PO CPDR: 40.0000 mg | DELAYED_RELEASE_CAPSULE | Freq: Every day | ORAL | 0 refills | Status: DC

## 2022-06-09 NOTE — Patient Instructions (Addendum)
Gastroesophageal Reflux Disease, Adult Gastroesophageal reflux (GER) happens when acid from the stomach flows up into the tube that connects the mouth and the stomach (esophagus). Normally, food travels down the esophagus and stays in the stomach to be digested. However, when a person has GER, food and stomach acid sometimes move back up into the esophagus. If this becomes a more serious problem, the person may be diagnosed with a disease called gastroesophageal reflux disease (GERD). GERD occurs when the reflux: Happens often. Causes frequent or severe symptoms. Causes problems such as damage to the esophagus. When stomach acid comes in contact with the esophagus, the acid may cause inflammation in the esophagus. Over time, GERD may create small holes (ulcers) in the lining of the esophagus. What are the causes? This condition is caused by a problem with the muscle between the esophagus and the stomach (lower esophageal sphincter, or LES). Normally, the LES muscle closes after food passes through the esophagus to the stomach. When the LES is weakened or abnormal, it does not close properly, and that allows food and stomach acid to go back up into the esophagus. The LES can be weakened by certain dietary substances, medicines, and medical conditions, including: Tobacco use. Pregnancy. Having a hiatal hernia. Alcohol use. Certain foods and beverages, such as coffee, chocolate, onions, and peppermint. What increases the risk? You are more likely to develop this condition if you: Have an increased body weight. Have a connective tissue disorder. Take NSAIDs, such as ibuprofen. What are the signs or symptoms? Symptoms of this condition include: Heartburn. Difficult or painful swallowing and the feeling of having a lump in the throat. A bitter taste in the mouth. Bad breath and having a large amount of saliva. Having an upset or bloated stomach and belching. Chest pain. Different conditions can  cause chest pain. Make sure you see your health care provider if you experience chest pain. Shortness of breath or wheezing. Ongoing (chronic) cough or a nighttime cough. Wearing away of tooth enamel. Weight loss. How is this diagnosed? This condition may be diagnosed based on a medical history and a physical exam. To determine if you have mild or severe GERD, your health care provider may also monitor how you respond to treatment. You may also have tests, including: A test to examine your stomach and esophagus with a small camera (endoscopy). A test that measures the acidity level in your esophagus. A test that measures how much pressure is on your esophagus. A barium swallow or modified barium swallow test to show the shape, size, and functioning of your esophagus. How is this treated? Treatment for this condition may vary depending on how severe your symptoms are. Your health care provider may recommend: Changes to your diet. Medicine. Surgery. The goal of treatment is to help relieve your symptoms and to prevent complications. Follow these instructions at home: Eating and drinking  Follow a diet as recommended by your health care provider. This may involve avoiding foods and drinks such as: Coffee and tea, with or without caffeine. Drinks that contain alcohol. Energy drinks and sports drinks. Carbonated drinks or sodas. Chocolate and cocoa. Peppermint and mint flavorings. Garlic and onions. Horseradish. Spicy and acidic foods, including peppers, chili powder, curry powder, vinegar, hot sauces, and barbecue sauce. Citrus fruit juices and citrus fruits, such as oranges, lemons, and limes. Tomato-based foods, such as red sauce, chili, salsa, and pizza with red sauce. Fried and fatty foods, such as donuts, french fries, potato chips, and high-fat dressings.   High-fat meats, such as hot dogs and fatty cuts of red and white meats, such as rib eye steak, sausage, ham, and  bacon. High-fat dairy items, such as whole milk, butter, and cream cheese. Eat small, frequent meals instead of large meals. Avoid drinking large amounts of liquid with your meals. Avoid eating meals during the 2-3 hours before bedtime. Avoid lying down right after you eat. Do not exercise right after you eat. Lifestyle  Do not use any products that contain nicotine or tobacco. These products include cigarettes, chewing tobacco, and vaping devices, such as e-cigarettes. If you need help quitting, ask your health care provider. Try to reduce your stress by using methods such as yoga or meditation. If you need help reducing stress, ask your health care provider. If you are overweight, reduce your weight to an amount that is healthy for you. Ask your health care provider for guidance about a safe weight loss goal. General instructions Pay attention to any changes in your symptoms. Take over-the-counter and prescription medicines only as told by your health care provider. Do not take aspirin, ibuprofen, or other NSAIDs unless your health care provider told you to take these medicines. Wear loose-fitting clothing. Do not wear anything tight around your waist that causes pressure on your abdomen. Raise (elevate) the head of your bed about 6 inches (15 cm). You can use a wedge to do this. Avoid bending over if this makes your symptoms worse. Keep all follow-up visits. This is important. Contact a health care provider if: You have: New symptoms. Unexplained weight loss. Difficulty swallowing or it hurts to swallow. Wheezing or a persistent cough. A hoarse voice. Your symptoms do not improve with treatment. Get help right away if: You have sudden pain in your arms, neck, jaw, teeth, or back. You suddenly feel sweaty, dizzy, or light-headed. You have chest pain or shortness of breath. You vomit and the vomit is green, yellow, or black, or it looks like blood or coffee grounds. You faint. You  have stool that is red, bloody, or black. You cannot swallow, drink, or eat. These symptoms may represent a serious problem that is an emergency. Do not wait to see if the symptoms will go away. Get medical help right away. Call your local emergency services (911 in the [place] Do not drive yourself to the hospital. Summary Gastroesophageal reflux happens when acid from the stomach flows up into the esophagus. GERD is a disease in which the reflux happens often, causes frequent or severe symptoms, or causes problems such as damage to the esophagus. Treatment for this condition may vary depending on how severe your symptoms are. Your health care provider may recommend diet and lifestyle changes, medicine, or surgery. Contact a health care provider if you have new or worsening symptoms. Take over-the-counter and prescription medicines only as told by your health care provider. Do not take aspirin, ibuprofen, or other NSAIDs unless your health care provider told you to do so. Keep all follow-up visits as told by your health care provider. This is important. This information is not intended to replace advice given to you by your health care provider. Make sure you discuss any questions you have with your health care provider. Document Revised: 03/15/2020 Document Reviewed: 03/15/2020 Elsevier Patient Education  2023 [employer]  

## 2022-07-07 DIAGNOSIS — R748 Abnormal levels of other serum enzymes: Secondary | ICD-10-CM | POA: Diagnosis not present

## 2022-12-07 NOTE — Progress Notes (Signed)
Name: Jeffrey Harding   MRN: [id number]    DOB: [birth year]   Date:12/08/2022       Progress Note  Subjective  Chief Complaint  Annual Exam  HPI  Patient presents for annual CPE.  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, sometimes limit options  Exercise: due to working too many years  Last Dental Exam: due for a visit  Last Eye Exam: not up to date   Depression: phq 9 is negative    12/08/2022    8:06 AM 12/08/2022    8:05 AM 06/09/2022    7:44 AM 01/27/2022    1:51 PM 12/02/2021    7:41 AM  Depression screen PHQ 2/9  Decreased Interest 0 0 0 0 0  Down, Depressed, Hopeless 0 0 0 0 0  PHQ - 2 Score 0 0 0 0 0  Altered sleeping 0  0  0  Tired, decreased energy 0  0  0  Change in appetite 0  0  0  Feeling bad or failure about yourself  0  0  0  Trouble concentrating 0  0  0  Moving slowly or fidgety/restless 0  0  0  Suicidal thoughts 0  0  0  PHQ-9 Score 0  0  0  Difficult doing work/chores Not difficult at all        Hypertension:  BP Readings from Last 3 Encounters:  12/08/22 138/84  06/09/22 128/80  01/27/22 110/68    Obesity: Wt Readings from Last 3 Encounters:  12/08/22 183 lb (83 kg)  06/09/22 183 lb (83 kg)  01/27/22 175 lb (79.4 kg)   BMI Readings from Last 3 Encounters:  12/08/22 27.02 kg/m  06/09/22 27.02 kg/m  01/27/22 25.84 kg/m     Lipids:  Lab Results  Component Value Date   CHOL 168 12/02/2021   CHOL 185 11/26/2020   CHOL 111 09/11/2019   Lab Results  Component Value Date   HDL 45 12/02/2021   HDL 41 11/26/2020   HDL 39 (L) 09/11/2019   Lab Results  Component Value Date   LDLCALC 107 (H) 12/02/2021   LDLCALC 124 (H) 11/26/2020   LDLCALC 56 09/11/2019   Lab Results  Component Value Date   TRIG 72 12/02/2021   TRIG 92 11/26/2020   TRIG 75 09/11/2019   Lab Results  Component Value Date   CHOLHDL 3.7 12/02/2021   CHOLHDL 4.5 11/26/2020   CHOLHDL 2.8 09/11/2019   No results found for: "LDLDIRECT" Glucose:  Glucose,  Bld  Date Value Ref Range Status  12/02/2021 92 65 - 99 mg/dL Final    Comment:    .            Fasting reference interval .   07/28/2021 102 (H) 65 - 99 mg/dL Final    Comment:    .            Fasting reference interval . For someone without known diabetes, a glucose value between 100 and 125 mg/dL is consistent with prediabetes and should be confirmed with a follow-up test. .   11/26/2020 100 (H) 65 - 99 mg/dL Final    Comment:    .            Fasting reference interval . For someone without known diabetes, a glucose value between 100 and 125 mg/dL is consistent with prediabetes and should be confirmed with a follow-up test. .     [place]  Visit from 12/08/2022 in [facility]  AUDIT-C Score 1      Divorced STD testing and prevention (HIV/chl/gon/syphilis): N/A Sexual history: having problems with his penile pump and will see his urologist  Hep C Screening: 06/12/14 Skin cancer: Discussed monitoring for atypical lesions Colorectal cancer: 01/13/22 Prostate cancer:   Lab Results  Component Value Date   PSA <0.04 12/02/2021   PSA <0.04 11/26/2020     Lung cancer:  Low Dose CT Chest recommended if Age 31-80 years, 30 pack-year currently smoking OR have quit w/in 15years. Patient  no a candidate for screening   AAA: The USPSTF recommends one-time screening with ultrasonography in men ages 72 to 26 years who have ever smoked. Patient   no, a candidate for screening  ECG:  08/15/15  Vaccines:   Tdap: up to date Shingrix: up to date Pneumonia: N/A Flu: refused  COVID-19: up to date  Advanced Care Planning: A voluntary discussion about advance care planning including the explanation and discussion of advance directives.  Discussed health care proxy and Living will, and the patient was able to identify a health care proxy as daughter .  Patient does not have a living will and power of attorney of health care   Patient Active  Problem List   Diagnosis Date Noted   Elevated CK 12/02/2021   Perennial allergic rhinitis with seasonal variation 06/18/2015   H/O malignant neoplasm of prostate 06/18/2015   History of prostatectomy 06/18/2015   Vitamin D deficiency 06/18/2015   Dyslipidemia 06/18/2015   ED (erectile dysfunction) of organic origin 05/23/2012    Past Surgical History:  Procedure Laterality Date   COLONOSCOPY     COLONOSCOPY WITH PROPOFOL N/A 01/13/2022   Procedure: COLONOSCOPY WITH PROPOFOL;  Surgeon: Jonathon Bellows, MD;  Location: [facility] ENDOSCOPY;  Service: Gastroenterology;  Laterality: N/A;   CYST EXCISION  09/19/2007   back   LASER OF PROSTATE W/ GREEN LIGHT PVP  09/18/2006   PENILE PROSTHESIS IMPLANT     PROSTATECTOMY     TONSILECTOMY/ADENOIDECTOMY WITH MYRINGOTOMY      Family History  Problem Relation Age of Onset   Hypertension Mother    Hyperlipidemia Mother    Heart disease Mother    Diabetes Mother    Heart disease Father    Hyperlipidemia Father    Hypertension Father    Cancer Father        prostate   Hypertension Sister    Hyperlipidemia Sister    Lupus Sister    Hypertension Sister     Social History   Socioeconomic History   Marital status: Divorced    Spouse name: Not on file   Number of children: 1   Years of education: 12   Highest education level: High school graduate  Occupational History   Occupation: truck driver  Tobacco Use   Smoking status: Never   Smokeless tobacco: Never  Vaping Use   Vaping Use: Never used  Substance and Sexual Activity   Alcohol use: Yes    Alcohol/week: 0.0 standard drinks of alcohol    Comment: rarely   Drug use: No   Sexual activity: Not Currently    Partners: Female  Other Topics Concern   Not on file  Social History Narrative   Lives alone, 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    Social Determinants of Radio broadcast assistant Strain: [place]  (12/08/2022)   Overall Emergency planning/management officer  Strain (  CARDIA)    Difficulty of Paying Living Expenses: Not hard at all  Food Insecurity: No Food Insecurity (12/08/2022)   Hunger Vital Sign    Worried About Running Out of Food in the Last Year: Never true    Ran Out of Food in the Last Year: Never true  Transportation Needs: No Transportation Needs (12/08/2022)   PRAPARE - Hydrologist (Medical): No    Lack of Transportation (Non-Medical): No  Physical Activity: Insufficiently Active (12/08/2022)   Exercise Vital Sign    Days of Exercise per Week: 3 days    Minutes of Exercise per Session: 30 min  Stress: No Stress Concern Present (12/08/2022)   [place]    Feeling of Stress : Not at all  Social Connections: Moderately Integrated (12/08/2022)   Social Connection and Isolation Panel [NHANES]    Frequency of Communication with Friends and Family: More than three times a week    Frequency of Social Gatherings with Friends and Family: More than three times a week    Attends Religious Services: More than 4 times per year    Active Member of [employer] or Organizations: Yes    Attends Music therapist: More than 4 times per year    Marital Status: Divorced  Intimate Partner Violence: Not At Risk (12/08/2022)   Humiliation, Afraid, Rape, and Kick questionnaire    Fear of Current or Ex-Partner: No    Emotionally Abused: No    Physically Abused: No    Sexually Abused: No     Current Outpatient Medications:    Ascorbic Acid (VITAMIN C) 100 MG tablet, Take 1 tablet (100 mg total) by mouth daily., Disp: 30 tablet, Rfl: 0   cholecalciferol (VITAMIN D) 1000 units tablet, Take 1,000 Units by mouth daily., Disp: , Rfl:    omeprazole (PRILOSEC) 40 MG capsule, TAKE 1 CAPSULE (40 MG TOTAL) BY MOUTH DAILY BEFORE SUPPER., Disp: 30 capsule, Rfl: 0  No Known Allergies   ROS  Constitutional: Negative for fever or weight change.  Respiratory:  Negative for cough and shortness of breath.   Cardiovascular: Negative for chest pain or palpitations.  Gastrointestinal: Negative for abdominal pain, no bowel changes.  Musculoskeletal: Negative for gait problem or joint swelling.  Skin: Negative for rash.  Neurological: Negative for dizziness or headache.  No other specific complaints in a complete review of systems (except as listed in HPI above).    Objective  Vitals:   12/08/22 0801  BP: 138/84  Pulse: 80  Resp: 16  Temp: 97.8 F (36.6 C)  TempSrc: Oral  SpO2: 98%  Weight: 183 lb (83 kg)  Height: 5\' 9"  (1.753 m)    Body mass index is 27.02 kg/m.  Physical Exam  Constitutional: Patient appears well-developed and well-nourished. No distress.  HENT: Head: Normocephalic and atraumatic. Ears: B TMs ok, no erythema or effusion; Nose: Nose normal. Mouth/Throat: Oropharynx is clear and moist. No oropharyngeal exudate.  Eyes: Conjunctivae and EOM are normal. Pupils are equal, round, and reactive to light. No scleral icterus.  Neck: Normal range of motion. Neck supple. No JVD present. No thyromegaly present.  Cardiovascular: Normal rate, regular rhythm and normal heart sounds.  No murmur heard. No BLE edema. Pulmonary/Chest: Effort normal and breath sounds normal. No respiratory distress. Abdominal: Soft. Bowel sounds are normal, no distension. There is no tenderness. no masses MALE GENITALIA: not done - he will see urologist RECTAL: not done  Musculoskeletal: Normal range of motion, no joint effusions. No gross deformities Neurological: he is alert and oriented to person, place, and time. No cranial nerve deficit. Coordination, balance, strength, speech and gait are normal.  Skin: Skin is warm and dry. No rash noted. No erythema.  He has a sebaceous cyst on mid back, no signs of infection  Psychiatric: Patient has a normal mood and affect. behavior is normal. Judgment and thought content normal.   Fall Risk:    12/08/2022     8:06 AM 06/09/2022    7:44 AM 01/27/2022    1:51 PM 12/02/2021    7:41 AM 07/28/2021    8:09 AM  Fall Risk   Falls in the past year? 0 1 0 0 1  Number falls in past yr: 0 0 0 0 1  Injury with Fall? 0 0 0 0 0  Risk for fall due to :  No Fall Risks  No Fall Risks History of fall(s)  Follow up  Falls prevention discussed Falls evaluation completed Falls prevention discussed Falls evaluation completed     Functional Status Survey: Is the patient deaf or have difficulty hearing?: No Does the patient have difficulty seeing, even when wearing glasses/contacts?: No Does the patient have difficulty concentrating, remembering, or making decisions?: No Does the patient have difficulty walking or climbing stairs?: No Does the patient have difficulty dressing or bathing?: No Does the patient have difficulty doing errands alone such as visiting a doctor's office or shopping?: No    Assessment & Plan  1. Well adult exam  - VITAMIN D 25 Hydroxy (Vit-D Deficiency, Fractures) - Hemoglobin A1c - CBC with Differential/Platelet - COMPLETE METABOLIC PANEL WITH GFR - Lipid panel - H. pylori breath test - PSA  2. Dyslipidemia  - Lipid panel  3. Vitamin D deficiency  - VITAMIN D 25 Hydroxy (Vit-D Deficiency, Fractures)  4. Pre-diabetes  - Hemoglobin A1c  5. GERD without esophagitis  Not doing well, symptoms not controlled even on PPI discussed importance of returning and will likely have to refer him back to GI  6. Leukocytosis, unspecified type  - CBC with Differential/Platelet  7. Dyspepsia  - COMPLETE METABOLIC PANEL WITH GFR - H. pylori breath test  8. History of prostatectomy  - PSA  Referral to urologist  Penile pump not working    -Prostate cancer screening and PSA options (with potential risks and benefits of testing vs not testing) were discussed along with recent recs/guidelines. -USPSTF grade A and B recommendations reviewed with patient; age-appropriate  recommendations, preventive care, screening tests, etc discussed and encouraged; healthy living encouraged; see AVS for patient education given to patient -Discussed importance of 150 minutes of physical activity weekly, eat two servings of fish weekly, eat one serving of tree nuts ( cashews, pistachios, pecans, almonds.Marland Kitchen) every other day, eat 6 servings of fruit/vegetables daily and drink plenty of water and avoid sweet beverages.  -Reviewed Health Maintenance: yes

## 2022-12-07 NOTE — Patient Instructions (Signed)

## 2022-12-08 ENCOUNTER — Encounter: Payer: Self-pay | Admitting: Family Medicine

## 2022-12-08 ENCOUNTER — Ambulatory Visit (INDEPENDENT_AMBULATORY_CARE_PROVIDER_SITE_OTHER): Payer: BC Managed Care – PPO | Admitting: Family Medicine

## 2022-12-08 VITALS — BP 138/84 | HR 80 | Temp 97.8°F | Resp 16 | Ht 69.0 in | Wt 183.0 lb

## 2022-12-08 DIAGNOSIS — D72829 Elevated white blood cell count, unspecified: Secondary | ICD-10-CM | POA: Diagnosis not present

## 2022-12-08 DIAGNOSIS — E559 Vitamin D deficiency, unspecified: Secondary | ICD-10-CM

## 2022-12-08 DIAGNOSIS — Z0000 Encounter for general adult medical examination without abnormal findings: Secondary | ICD-10-CM

## 2022-12-08 DIAGNOSIS — E785 Hyperlipidemia, unspecified: Secondary | ICD-10-CM

## 2022-12-08 DIAGNOSIS — R7303 Prediabetes: Secondary | ICD-10-CM | POA: Diagnosis not present

## 2022-12-08 DIAGNOSIS — R1013 Epigastric pain: Secondary | ICD-10-CM

## 2022-12-08 DIAGNOSIS — K219 Gastro-esophageal reflux disease without esophagitis: Secondary | ICD-10-CM

## 2022-12-08 DIAGNOSIS — N529 Male erectile dysfunction, unspecified: Secondary | ICD-10-CM

## 2022-12-08 DIAGNOSIS — Z9079 Acquired absence of other genital organ(s): Secondary | ICD-10-CM

## 2022-12-12 LAB — CBC WITH DIFFERENTIAL/PLATELET
Absolute Monocytes: 359 cells/uL (ref 200–950)
Basophils Absolute: 32 cells/uL (ref 0–200)
Basophils Relative: 0.7 %
Eosinophils Absolute: 60 cells/uL (ref 15–500)
Eosinophils Relative: 1.3 %
HCT: 44.9 % (ref 38.5–50.0)
Hemoglobin: 14.7 g/dL (ref 13.2–17.1)
Lymphs Abs: 1624 cells/uL (ref 850–3900)
MCH: 30.2 pg (ref 27.0–33.0)
MCHC: 32.7 g/dL (ref 32.0–36.0)
MCV: 92.2 fL (ref 80.0–100.0)
MPV: 11.1 fL (ref 7.5–12.5)
Monocytes Relative: 7.8 %
Neutro Abs: 2525 cells/uL (ref 1500–7800)
Neutrophils Relative %: 54.9 %
Platelets: 217 10*3/uL (ref 140–400)
RBC: 4.87 10*6/uL (ref 4.20–5.80)
RDW: 12.6 % (ref 11.0–15.0)
Total Lymphocyte: 35.3 %
WBC: 4.6 10*3/uL (ref 3.8–10.8)

## 2022-12-12 LAB — LIPID PANEL
Cholesterol: 194 mg/dL (ref <200)
HDL: 44 mg/dL (ref >=40)
LDL Cholesterol (Calc): 134 mg/dL (calc) — ABNORMAL HIGH
Non-HDL Cholesterol (Calc): 150 mg/dL (calc) — ABNORMAL HIGH (ref <130)
Total CHOL/HDL Ratio: 4.4 (calc) (ref <5.0)
Triglycerides: 64 mg/dL (ref <150)

## 2022-12-12 LAB — HEMOGLOBIN A1C
Hgb A1c MFr Bld: 5.8 % of total Hgb — ABNORMAL HIGH (ref <5.7)
Mean Plasma Glucose: 120 mg/dL
eAG (mmol/L): 6.6 mmol/L

## 2022-12-12 LAB — COMPLETE METABOLIC PANEL WITH GFR
AG Ratio: 1.4 (calc) (ref 1.0–2.5)
ALT: 24 U/L (ref 9–46)
AST: 23 U/L (ref 10–35)
Albumin: 4.2 g/dL (ref 3.6–5.1)
Alkaline phosphatase (APISO): 57 U/L (ref 35–144)
BUN: 13 mg/dL (ref 7–25)
CO2: 26 mmol/L (ref 20–32)
Calcium: 9.1 mg/dL (ref 8.6–10.3)
Chloride: 103 mmol/L (ref 98–110)
Creat: 0.94 mg/dL (ref 0.70–1.35)
Globulin: 3.1 g/dL (calc) (ref 1.9–3.7)
Glucose, Bld: 107 mg/dL — ABNORMAL HIGH (ref 65–99)
Potassium: 4.1 mmol/L (ref 3.5–5.3)
Sodium: 140 mmol/L (ref 135–146)
Total Bilirubin: 0.7 mg/dL (ref 0.2–1.2)
Total Protein: 7.3 g/dL (ref 6.1–8.1)
eGFR: 92 mL/min/{1.73_m2} (ref >=60)

## 2022-12-12 LAB — H. PYLORI BREATH TEST: H. pylori Breath Test: NOT DETECTED

## 2022-12-12 LAB — VITAMIN D 25 HYDROXY (VIT D DEFICIENCY, FRACTURES): Vit D, 25-Hydroxy: 43 ng/mL (ref 30–100)

## 2022-12-12 LAB — PSA: PSA: <0.04 ng/mL (ref <=4.00)

## 2023-01-04 ENCOUNTER — Ambulatory Visit: Payer: BC Managed Care – PPO | Admitting: Urology

## 2023-01-04 ENCOUNTER — Encounter: Payer: Self-pay | Admitting: Urology

## 2023-01-04 VITALS — BP 138/83 | HR 72 | Ht 69.0 in | Wt 180.0 lb

## 2023-01-04 DIAGNOSIS — C61 Malignant neoplasm of prostate: Secondary | ICD-10-CM

## 2023-01-04 DIAGNOSIS — T83490A Other mechanical complication of penile (implanted) prosthesis, initial encounter: Secondary | ICD-10-CM

## 2023-01-04 NOTE — Progress Notes (Signed)
I, [name],acting as a scribe for Jeffrey Scotland, MD.,have documented all relevant documentation on the behalf of Jeffrey Scotland, MD,as directed by  Jeffrey Scotland, MD while in the presence of Jeffrey Scotland, MD.   01/04/23 1:48 PM   Jeffrey Harding 13-Nov-1960 [id number]  Referring provider: Alba Cory, MD [address] [place],  [place] [address]  Chief Complaint  Patient presents with   Establish Care   Impotence sexual    HPI: 62 year-old male with a personal history of prostate cancer who returns today to reestablish care. He was seen once in our clinic in 2020. Prior to that he was followed at [facility].   He was initially diagnosed with prostate cancer on 05/31/2006 secondary to elevated PSA of 2.0.  He underwent prostate biopsy revealing 2 of 12 cores positive for Gleason 3+3 disease.  He did like to have a robotic prostatectomy (Dr. Virgilio Frees) 09/25/2006 pathologic pT2c, N0, Mx, R0 with a Gleason score of 3+3=6 with organ confined- disease.  PSA is remained stably low ever since, 0.01.   His most recent PSA on 12/08/22 was <0.04.  He also has a personal history of erectile dysfunction status post placement of inflatable penile prosthesis. This was placed in 2009.   He states that the implant is not functioning properly. He reports that although the device inflates, it fails to fully deflate, leaving the prosthesis partially inflated. This issue persists despite attempts to manipulate the deflation mechanism. He has attempted to address the problem by pressing the release button, which should ideally result in complete deflation, but this has not been successful. He denies any pain associated with the device but expresses nervousness and frustration with its current state.  PMH: Past Medical History:  Diagnosis Date   Allergy    Cancer 2008   prostate   H/O prostatectomy    Hyperlipidemia    Over weight    Vitamin D deficiency     Surgical History: Past  Surgical History:  Procedure Laterality Date   COLONOSCOPY     COLONOSCOPY WITH PROPOFOL N/A 01/13/2022   Procedure: COLONOSCOPY WITH PROPOFOL;  Surgeon: Wyline Mood, MD;  Location: [facility] ENDOSCOPY;  Service: Gastroenterology;  Laterality: N/A;   CYST EXCISION  09/19/2007   back   LASER OF PROSTATE W/ GREEN LIGHT PVP  09/18/2006   PENILE PROSTHESIS IMPLANT     PROSTATECTOMY     TONSILECTOMY/ADENOIDECTOMY WITH MYRINGOTOMY      Home Medications:  Allergies as of 01/04/2023   No Known Allergies      Medication List        Accurate as of January 04, 2023  1:48 PM. If you have any questions, ask your nurse or doctor.          cholecalciferol 1000 units tablet Commonly known as: VITAMIN D Take 1,000 Units by mouth daily.   omeprazole 40 MG capsule Commonly known as: PRILOSEC TAKE 1 CAPSULE (40 MG TOTAL) BY MOUTH DAILY BEFORE SUPPER.   vitamin C 100 MG tablet Take 1 tablet (100 mg total) by mouth daily.        Family History: Family History  Problem Relation Age of Onset   Hypertension Mother    Hyperlipidemia Mother    Heart disease Mother    Diabetes Mother    Heart disease Father    Hyperlipidemia Father    Hypertension Father    Cancer Father        prostate   Hypertension Sister  Hyperlipidemia Sister    Lupus Sister    Hypertension Sister     Social History:  reports that he has never smoked. He has never used smokeless tobacco. He reports current alcohol use. He reports that he does not use drugs.   Physical Exam: BP 138/83   Pulse 72   Ht  (1.753 m)   Wt 180 lb (81.6 kg)   BMI 26.58 kg/m   Constitutional:  Alert and oriented, No acute distress. HEENT: [place] AT, moist mucus membranes.  Trachea midline, no masses. GU: Three-piece penile prosthesis in place, able to inflate his device, but it never fully deflated. It did not auto deflate.  Cylinders appear to be in good position. Neurologic: Grossly intact, no focal deficits, moving all 4  extremities. Psychiatric: Normal mood and affect.   Assessment & Plan:    Malfunction penile prosthesis - Likely an AMS device based on the configuration of his pump. He reports it was never fully functional, completely deflated, sometimes will auto deflate partially after several minutes.  Able to demonstrate failure to completely deflate today by cycling the pump however, did not appreciate the auto deflation. It appeared that the possible lockout mechanism of fluid in the system is malfunctioning. -Recommend a referral to [facility], Dr. Radene Journey who specialize in prosthetics to evaluate for possible revision.  History of prostate cancer -No evidence of disease -PSA is undetectable -Continue annual PSA  Return for referral sent to [facility].  I have reviewed the above documentation for accuracy and completeness, and I agree with the above.   Jeffrey Scotland, MD   [facility] [address] [place], [place] [address] [phone]  I spent 51 total minutes on the day of the encounter including pre-visit review of the medical record, face-to-face time with the patient, and post visit ordering of labs/imaging/tests.

## 2023-01-04 NOTE — Progress Notes (Signed)
Name: Jeffrey Harding   MRN: [id number]    DOB: 1961-01-07   Date:01/05/2023       Progress Note  Subjective  Chief Complaint  Follow Up  HPI  Dyslipidemia: he was on Atorvastatin for many years, however last time his LFT"s spiked, he had a change in insurance and was seen at [facility] early 2021, had multiple tests and ferritin was high, he was advised to stay off statin therapy, LFT's improved, but CK has remained elevated. He is now seeing Dr. Allena Katz had multiple labs done and is following up with him every 6 months, Dr. Allena Katz thinks it is a physiological elevation He has an upcoming appointment, no muscle aches and last LFT were normal   The 10-year ASCVD risk score (Arnett DK, et al., 2019) is: 7.9%   Values used to calculate the score:     Age: 4 years     Sex: Male     Is Non-Hispanic African American: Yes     Diabetic: No     Tobacco smoker: No     Systolic Blood Pressure: 118 mmHg     Is BP treated: No     HDL Cholesterol: 44 mg/dL     Total Cholesterol: 194 mg/dL    Vitamin D def: he is taking otc supplementation and level is at goal   Pre-diabetes: he was following a low carb diet and had lost weight, it was down to 171 lbs, he started eating bread again and weight is back to 183 lbs however over the month he resumed a healthier diet and is down 5 lbs. Continue with life style modification   GERD: he states unable to sleep flat because it makes him choke , symptoms started about 5 months ago. Denies heart burn or indigestion. He states not improving with claritin or flonase.    Patient Active Problem List   Diagnosis Date Noted   Elevated CK 12/02/2021   Perennial allergic rhinitis with seasonal variation 06/18/2015   H/O malignant neoplasm of prostate 06/18/2015   History of prostatectomy 06/18/2015   Vitamin D deficiency 06/18/2015   Dyslipidemia 06/18/2015   ED (erectile dysfunction) of organic origin 05/23/2012    Past Surgical History:  Procedure Laterality Date    COLONOSCOPY     COLONOSCOPY WITH PROPOFOL N/A 01/13/2022   Procedure: COLONOSCOPY WITH PROPOFOL;  Surgeon: Wyline Mood, MD;  Location: [facility] ENDOSCOPY;  Service: Gastroenterology;  Laterality: N/A;   CYST EXCISION  09/19/2007   back   LASER OF PROSTATE W/ GREEN LIGHT PVP  09/18/2006   PENILE PROSTHESIS IMPLANT     PROSTATECTOMY     TONSILECTOMY/ADENOIDECTOMY WITH MYRINGOTOMY      Family History  Problem Relation Age of Onset   Hypertension Mother    Hyperlipidemia Mother    Heart disease Mother    Diabetes Mother    Heart disease Father    Hyperlipidemia Father    Hypertension Father    Cancer Father        prostate   Hypertension Sister    Hyperlipidemia Sister    Lupus Sister    Hypertension Sister     Social History   Tobacco Use   Smoking status: Never   Smokeless tobacco: Never  Substance Use Topics   Alcohol use: Yes    Alcohol/week: 0.0 standard drinks of alcohol    Comment: rarely     Current Outpatient Medications:    Ascorbic Acid (VITAMIN C) 100 MG tablet, Take 1 tablet (100  mg total) by mouth daily., Disp: 30 tablet, Rfl: 0   cholecalciferol (VITAMIN D) 1000 units tablet, Take 1,000 Units by mouth daily., Disp: , Rfl:    omeprazole (PRILOSEC) 40 MG capsule, TAKE 1 CAPSULE (40 MG TOTAL) BY MOUTH DAILY BEFORE SUPPER., Disp: 30 capsule, Rfl: 0  No Known Allergies  I personally reviewed active problem list, medication list, allergies, family history, social history, health maintenance with the patient/caregiver today.   ROS  Constitutional: Negative for fever , positive for  weight change.  Respiratory: Negative for cough and shortness of breath.   Cardiovascular: Negative for chest pain or palpitations.  Gastrointestinal: Negative for abdominal pain, no bowel changes.  Musculoskeletal: Negative for gait problem or joint swelling.  Skin: Negative for rash.  Neurological: Negative for dizziness or headache.  No other specific complaints in a complete  review of systems (except as listed in HPI above).   Objective  Vitals:   01/05/23 1111  BP: 118/68  Pulse: 87  Resp: 16  SpO2: 98%  Weight: 178 lb (80.7 kg)  Height:  (1.753 m)    Body mass index is 26.29 kg/m.  Physical Exam  Constitutional: Patient appears well-developed and well-nourished. No distress.  HEENT: head atraumatic, normocephalic, pupils equal and reactive to light, neck supple Cardiovascular: Normal rate, regular rhythm and normal heart sounds.  No murmur heard. No BLE edema. Pulmonary/Chest: Effort normal and breath sounds normal. No respiratory distress. Abdominal: Soft.  There is no tenderness. Psychiatric: Patient has a normal mood and affect. behavior is normal. Judgment and thought content normal.    PHQ2/9:    01/05/2023   11:11 AM 12/08/2022    8:06 AM 12/08/2022    8:05 AM 06/09/2022    7:44 AM 01/27/2022    1:51 PM  Depression screen PHQ 2/9  Decreased Interest 0 0 0 0 0  Down, Depressed, Hopeless 0 0 0 0 0  PHQ - 2 Score 0 0 0 0 0  Altered sleeping 0 0  0   Tired, decreased energy 0 0  0   Change in appetite 0 0  0   Feeling bad or failure about yourself  0 0  0   Trouble concentrating 0 0  0   Moving slowly or fidgety/restless 0 0  0   Suicidal thoughts 0 0  0   PHQ-9 Score 0 0  0   Difficult doing work/chores  Not difficult at all       phq 9 is negative   Fall Risk:    01/05/2023   11:10 AM 12/08/2022    8:06 AM 06/09/2022    7:44 AM 01/27/2022    1:51 PM 12/02/2021    7:41 AM  Fall Risk   Falls in the past year? 0 0 1 0 0  Number falls in past yr: 0 0 0 0 0  Injury with Fall? 0 0 0 0 0  Risk for fall due to : No Fall Risks  No Fall Risks  No Fall Risks  Follow up Falls prevention discussed  Falls prevention discussed Falls evaluation completed Falls prevention discussed      Functional Status Survey: Is the patient deaf or have difficulty hearing?: No Does the patient have difficulty seeing, even when wearing  glasses/contacts?: No Does the patient have difficulty concentrating, remembering, or making decisions?: No Does the patient have difficulty walking or climbing stairs?: No Does the patient have difficulty dressing or bathing?: No Does the patient have difficulty  doing errands alone such as visiting a doctor's office or shopping?: No    Assessment & Plan  1. GERD without esophagitis  - omeprazole (PRILOSEC) 40 MG capsule; Take 1 capsule (40 mg total) by mouth daily before supper.  Dispense: 90 capsule; Refill: 1  2. Pre-diabetes  Discussed diet  3. Vitamin D deficiency  Continue supplementation  4. Dyslipidemia   On life style modification

## 2023-01-05 ENCOUNTER — Ambulatory Visit (INDEPENDENT_AMBULATORY_CARE_PROVIDER_SITE_OTHER): Payer: BC Managed Care – PPO | Admitting: Family Medicine

## 2023-01-05 ENCOUNTER — Encounter: Payer: Self-pay | Admitting: Family Medicine

## 2023-01-05 VITALS — BP 118/68 | HR 87 | Resp 16 | Ht 69.0 in | Wt 178.0 lb

## 2023-01-05 DIAGNOSIS — E785 Hyperlipidemia, unspecified: Secondary | ICD-10-CM | POA: Diagnosis not present

## 2023-01-05 DIAGNOSIS — R7303 Prediabetes: Secondary | ICD-10-CM | POA: Diagnosis not present

## 2023-01-05 DIAGNOSIS — K219 Gastro-esophageal reflux disease without esophagitis: Secondary | ICD-10-CM | POA: Diagnosis not present

## 2023-01-05 DIAGNOSIS — E559 Vitamin D deficiency, unspecified: Secondary | ICD-10-CM

## 2023-01-05 MED ORDER — OMEPRAZOLE 40 MG PO CPDR: 40.0000 mg | DELAYED_RELEASE_CAPSULE | Freq: Every day | ORAL | 1 refills | Status: DC

## 2023-02-02 DIAGNOSIS — R748 Abnormal levels of other serum enzymes: Secondary | ICD-10-CM | POA: Diagnosis not present

## 2023-02-16 DIAGNOSIS — T83490A Other mechanical complication of penile (implanted) prosthesis, initial encounter: Secondary | ICD-10-CM | POA: Diagnosis not present

## 2023-02-16 DIAGNOSIS — N529 Male erectile dysfunction, unspecified: Secondary | ICD-10-CM | POA: Diagnosis not present

## 2023-02-27 ENCOUNTER — Other Ambulatory Visit: Payer: Self-pay | Admitting: Urology

## 2023-02-28 ENCOUNTER — Other Ambulatory Visit: Payer: Self-pay | Admitting: Family Medicine

## 2023-04-04 NOTE — Progress Notes (Addendum)
COVID Vaccine Completed:  Yes  Date of COVID positive in last 90 days: No  PCP - Alba Cory, MD Cardiologist - N/A  Chest x-ray -  N/A EKG -  N/A Stress Test -  N/A ECHO -  N/A Cardiac Cath -  N/A Pacemaker/ICD device last checked: Spinal Cord Stimulator: N/A  Bowel Prep -  N/A  Sleep Study -  N/A CPAP -   Fasting Blood Sugar -  N/A Checks Blood Sugar _____ times a day  Last dose of GLP1 agonist-  N/A GLP1 instructions:  N/A   Last dose of SGLT-2 inhibitors-  N/A SGLT-2 instructions: N/A   Blood Thinner Instructions:  Time Aspirin Instructions: Last Dose:  Activity level:  Can go up a flight of stairs and perform activities of daily living without stopping and without symptoms of chest pain or shortness of breath.  Able to exercise without symptoms  Anesthesia review:  N/A  Patient denies shortness of breath, fever, cough and chest pain at PAT appointment  Patient verbalized understanding of instructions that were given to them at the PAT appointment. Patient was also instructed that they will need to review over the PAT instructions again at home before surgery.

## 2023-04-04 NOTE — Patient Instructions (Addendum)
SURGICAL WAITING ROOM VISITATION Patients having surgery or a procedure may have no more than 2 support people in the waiting area - these visitors may rotate.    Children under the age of 13 must have an adult with them who is not the patient.  If the patient needs to stay at the hospital during part of their recovery, the visitor guidelines for inpatient rooms apply. Pre-op nurse will coordinate an appropriate time for 1 support person to accompany patient in pre-op.  This support person may not rotate.    Please refer to the [facility] website for the visitor guidelines for Inpatients (after your surgery is over and you are in a regular room).       Your procedure is scheduled on: 04-24-23   Report to [facility] Main Entrance    Report to admitting at 5:15 AM   Call this number if you have problems the morning of surgery [phone]   Do not eat food or drink liquids :After Midnight.           If you have questions, please contact your surgeon's office.   FOLLOW  ANY ADDITIONAL PRE OP INSTRUCTIONS YOU RECEIVED FROM YOUR SURGEON'S OFFICE!!!     Oral Hygiene is also important to reduce your risk of infection.                                    Remember - BRUSH YOUR TEETH THE MORNING OF SURGERY WITH YOUR REGULAR TOOTHPASTE   Do NOT smoke after Midnight   Take these medicines the morning of surgery with A SIP OF WATER:   Claritin  Omeprazole  Tylenol if needed                             You may not have any metal on your body including jewelry, and body piercing             Do not wear  lotions, powders, cologne, or deodorant              Men may shave face and neck.   Do not bring valuables to the hospital. [facility] IS NOT RESPONSIBLE   FOR VALUABLES.   Contacts, dentures or bridgework may not be worn into surgery.  DO NOT BRING YOUR HOME MEDICATIONS TO THE HOSPITAL. PHARMACY WILL DISPENSE MEDICATIONS LISTED ON YOUR MEDICATION LIST TO YOU DURING YOUR  ADMISSION IN THE HOSPITAL!    Patients discharged on the day of surgery will not be allowed to drive home.  Someone NEEDS to stay with you for the first 24 hours after anesthesia.                Please read over the following fact sheets you were given: IF YOU HAVE QUESTIONS ABOUT YOUR PRE-OP INSTRUCTIONS PLEASE CALL [phone] G[place]  If you received a COVID test during your pre-op visit  it is requested that you wear a mask when out in public, stay away from anyone that may not be feeling well and notify your surgeon if you develop symptoms. If you test positive for Covid or have been in contact with anyone that has tested positive in the last 10 days please notify you surgeon.  [facility] - Preparing for Surgery Before surgery, you can play an important role.  Because skin is not sterile, your skin  needs to be as free of germs as possible.  You can reduce the number of germs on your skin by washing with CHG (chlorahexidine gluconate) soap before surgery.  CHG is an antiseptic cleaner which kills germs and bonds with the skin to continue killing germs even after washing. Please DO NOT use if you have an allergy to CHG or antibacterial soaps.  If your skin becomes reddened/irritated stop using the CHG and inform your nurse when you arrive at Short Stay. Do not shave (including legs and underarms) for at least 48 hours prior to the first CHG shower.  You may shave your face/neck.  Please follow these instructions carefully:  1.  Shower with CHG Soap the night before surgery and the  morning of surgery.  2.  If you choose to wash your hair, wash your hair first as usual with your normal  shampoo.  3.  After you shampoo, rinse your hair and body thoroughly to remove the shampoo.                             4.  Use CHG as you would any other liquid soap.  You can apply chg directly to the skin and wash.  Gently with a scrungie or clean washcloth.  5.  Apply the CHG Soap to your body ONLY FROM THE  NECK DOWN.   Do   not use on face/ open                           Wound or open sores. Avoid contact with eyes, ears mouth and   genitals (private parts).                       Wash face,  Genitals (private parts) with your normal soap.             6.  Wash thoroughly, paying special attention to the area where your    surgery  will be performed.  7.  Thoroughly rinse your body with warm water from the neck down.  8.  DO NOT shower/wash with your normal soap after using and rinsing off the CHG Soap.                9.  Pat yourself dry with a clean towel.            10.  Wear clean pajamas.            11.  Place clean sheets on your bed the night of your first shower and do not  sleep with pets. Day of Surgery : Do not apply any lotions/deodorants the morning of surgery.  Please wear clean clothes to the hospital/surgery center.  FAILURE TO FOLLOW THESE INSTRUCTIONS MAY RESULT IN THE CANCELLATION OF YOUR SURGERY  PATIENT SIGNATURE_________________________________  NURSE SIGNATURE__________________________________  ________________________________________________________________________

## 2023-04-05 NOTE — Progress Notes (Signed)
Name: Jeffrey Harding   MRN: [id number]    DOB: Sep 05, 1961   Date:04/06/2023       Progress Note  Subjective  Chief Complaint  Follow Up  HPI  Dyslipidemia: he was on Atorvastatin for many years, however last time his LFT"s spiked, he had a change in insurance and was seen at [facility] early 2021, had multiple tests and ferritin was high, he was advised to stay off statin therapy, LFT's improved, but CK has remained elevated. He is now seeing Dr. Allena Katz had multiple labs done and is following up with him every 6 months, Dr. Allena Katz thinks it is a physiological elevation Since last visit he is eating healthier and exercising more .   The 10-year ASCVD risk score (Arnett DK, et al., 2019) is: 8.7%   Values used to calculate the score:     Age: 62 years     Sex: Male     Is Non-Hispanic African American: Yes     Diabetic: No     Tobacco smoker: No     Systolic Blood Pressure: 122 mmHg     Is BP treated: No     HDL Cholesterol: 44 mg/dL     Total Cholesterol: 194 mg/dL    Vitamin D deficiency : he is taking otc supplementation and level is at goal   Pre-diabetes: he was following a low carb diet and had lost weight, it was down to 171 lbs, he started eating bread again and weight is back to 183 lbs, he is no longer eating a lot of carbohydrates however he is now more active and feels better. Bulking up, sleeping better and longer, exercising 4-5 days per week.   GERD: he states unable to sleep flat because it makes him choke , symptoms started about one year  Denies heart burn or indigestion.  It did not improve with  claritin or flonase, but improved with PPI and takes 4-5 days a week, symptoms improved.    Pre-op: he will have surgery in August and was advised to have a preop evaluation. No SOB with activity, working out 4-5 days a week, able to go up a flight of stairs. No previous problems with anesthesia. Lbs normal and done in the past 3 months. No chest pain, palpitations or heart disease.    Patient Active Problem List   Diagnosis Date Noted   Elevated CK 12/02/2021   Perennial allergic rhinitis with seasonal variation 06/18/2015   H/O malignant neoplasm of prostate 06/18/2015   History of prostatectomy 06/18/2015   Vitamin D deficiency 06/18/2015   Dyslipidemia 06/18/2015   ED (erectile dysfunction) of organic origin 05/23/2012    Past Surgical History:  Procedure Laterality Date   COLONOSCOPY     COLONOSCOPY WITH PROPOFOL N/A 01/13/2022   Procedure: COLONOSCOPY WITH PROPOFOL;  Surgeon: Wyline Mood, MD;  Location: [facility] ENDOSCOPY;  Service: Gastroenterology;  Laterality: N/A;   CYST EXCISION  09/19/2007   back   LASER OF PROSTATE W/ GREEN LIGHT PVP  09/18/2006   PENILE PROSTHESIS IMPLANT     PROSTATECTOMY     TONSILECTOMY/ADENOIDECTOMY WITH MYRINGOTOMY      Family History  Problem Relation Age of Onset   Hypertension Mother    Hyperlipidemia Mother    Heart disease Mother    Diabetes Mother    Heart disease Father    Hyperlipidemia Father    Hypertension Father    Cancer Father        prostate   Hypertension Sister  Hyperlipidemia Sister    Lupus Sister    Hypertension Sister     Social History   Tobacco Use   Smoking status: Never   Smokeless tobacco: Never  Substance Use Topics   Alcohol use: Yes    Alcohol/week: 0.0 standard drinks of alcohol    Comment: rarely     Current Outpatient Medications:    acetaminophen (TYLENOL) 500 MG tablet, Take 500 mg by mouth every 6 (six) hours as needed for moderate pain., Disp: , Rfl:    Ascorbic Acid (VITAMIN C PO), Take 1 capsule by mouth daily., Disp: , Rfl:    loratadine (CLARITIN) 10 MG tablet, Take 10 mg by mouth daily as needed for allergies., Disp: , Rfl:    omeprazole (PRILOSEC) 40 MG capsule, Take 1 capsule (40 mg total) by mouth daily before supper., Disp: 90 capsule, Rfl: 1   VITAMIN D PO, Take 1 capsule by mouth daily., Disp: , Rfl:   No Known Allergies  I personally reviewed active  problem list, medication list, allergies, family history, social history, health maintenance with the patient/caregiver today.   ROS  Ten systems reviewed and is negative except as mentioned in HPI    Objective  Vitals:   04/06/23 0925  BP: 122/70  Pulse: 84  Resp: 16  SpO2: 98%  Weight: 183 lb (83 kg)  Height: 5\' 9"  (1.753 m)    Body mass index is 27.02 kg/m.  Physical Exam  Constitutional: Patient appears well-developed and well-nourished.  No distress.  HEENT: head atraumatic, normocephalic, pupils equal and reactive to light, , neck supple Cardiovascular: Normal rate, regular rhythm and normal heart sounds.  No murmur heard. No BLE edema. Pulmonary/Chest: Effort normal and breath sounds normal. No respiratory distress. Abdominal: Soft.  There is no tenderness. Psychiatric: Patient has a normal mood and affect. behavior is normal. Judgment and thought content normal.   PHQ2/9:    04/06/2023    9:25 AM 01/05/2023   11:11 AM 12/08/2022    8:06 AM 12/08/2022    8:05 AM 06/09/2022    7:44 AM  Depression screen PHQ 2/9  Decreased Interest 0 0 0 0 0  Down, Depressed, Hopeless 0 0 0 0 0  PHQ - 2 Score 0 0 0 0 0  Altered sleeping 0 0 0  0  Tired, decreased energy 0 0 0  0  Change in appetite 0 0 0  0  Feeling bad or failure about yourself  0 0 0  0  Trouble concentrating 0 0 0  0  Moving slowly or fidgety/restless 0 0 0  0  Suicidal thoughts 0 0 0  0  PHQ-9 Score 0 0 0  0  Difficult doing work/chores   Not difficult at all      phq 9 is negative   Fall Risk:    04/06/2023    9:25 AM 01/05/2023   11:10 AM 12/08/2022    8:06 AM 06/09/2022    7:44 AM 01/27/2022    1:51 PM  Fall Risk   Falls in the past year? 0 0 0 1 0  Number falls in past yr: 0 0 0 0 0  Injury with Fall? 0 0 0 0 0  Risk for fall due to : No Fall Risks No Fall Risks  No Fall Risks   Follow up Falls prevention discussed Falls prevention discussed  Falls prevention discussed Falls evaluation completed       Functional Status Survey: Is the patient deaf  or have difficulty hearing?: No Does the patient have difficulty seeing, even when wearing glasses/contacts?: No Does the patient have difficulty concentrating, remembering, or making decisions?: No Does the patient have difficulty walking or climbing stairs?: No Does the patient have difficulty dressing or bathing?: No Does the patient have difficulty doing errands alone such as visiting a doctor's office or shopping?: No    Assessment & Plan  1. GERD without esophagitis  Continue PPI  2. Dyslipidemia  Cut down on red meat and fried food   3. Pre-diabetes  Continue low carb diet   4. Vitamin D deficiency  Continue supplementation   5. H/O malignant neoplasm of prostate

## 2023-04-06 ENCOUNTER — Encounter: Payer: Self-pay | Admitting: Family Medicine

## 2023-04-06 ENCOUNTER — Encounter (HOSPITAL_COMMUNITY)
Admission: RE | Admit: 2023-04-06 | Discharge: 2023-04-06 | Disposition: A | Discharge status: Home / Self Care | Payer: BC Managed Care – PPO | Source: Ambulatory Visit | Attending: Urology | Admitting: Urology

## 2023-04-06 ENCOUNTER — Encounter (HOSPITAL_COMMUNITY): Payer: Self-pay

## 2023-04-06 ENCOUNTER — Ambulatory Visit (INDEPENDENT_AMBULATORY_CARE_PROVIDER_SITE_OTHER): Payer: BC Managed Care – PPO | Admitting: Family Medicine

## 2023-04-06 ENCOUNTER — Other Ambulatory Visit: Payer: Self-pay

## 2023-04-06 VITALS — BP 122/70 | HR 84 | Resp 16 | Ht 69.0 in | Wt 183.0 lb

## 2023-04-06 VITALS — BP 154/85 | HR 64 | Temp 98.4°F | Resp 16 | Ht 69.0 in | Wt 183.4 lb

## 2023-04-06 DIAGNOSIS — E559 Vitamin D deficiency, unspecified: Secondary | ICD-10-CM | POA: Diagnosis not present

## 2023-04-06 DIAGNOSIS — R7303 Prediabetes: Secondary | ICD-10-CM

## 2023-04-06 DIAGNOSIS — Z01818 Encounter for other preprocedural examination: Secondary | ICD-10-CM

## 2023-04-06 DIAGNOSIS — E785 Hyperlipidemia, unspecified: Secondary | ICD-10-CM | POA: Diagnosis not present

## 2023-04-06 DIAGNOSIS — K219 Gastro-esophageal reflux disease without esophagitis: Secondary | ICD-10-CM | POA: Diagnosis not present

## 2023-04-06 DIAGNOSIS — Z01812 Encounter for preprocedural laboratory examination: Secondary | ICD-10-CM | POA: Diagnosis not present

## 2023-04-06 DIAGNOSIS — Z8546 Personal history of malignant neoplasm of prostate: Secondary | ICD-10-CM

## 2023-04-06 LAB — CBC
HCT: 43.5 % (ref 39.0–52.0)
Hemoglobin: 14.3 g/dL (ref 13.0–17.0)
MCH: 30.8 pg (ref 26.0–34.0)
MCHC: 32.9 g/dL (ref 30.0–36.0)
MCV: 93.8 fL (ref 80.0–100.0)
Platelets: 245 10*3/uL (ref 150–400)
RBC: 4.64 MIL/uL (ref 4.22–5.81)
RDW: 14.2 % (ref 11.5–15.5)
WBC: 5.5 10*3/uL (ref 4.0–10.5)
nRBC: 0 % (ref 0.0–0.2)

## 2023-04-07 LAB — URINE CULTURE: Culture: NO GROWTH

## 2023-04-10 ENCOUNTER — Telehealth: Payer: Self-pay | Admitting: Family Medicine

## 2023-04-10 NOTE — Telephone Encounter (Signed)
Patient called to make sure the forms were completed from his 7/19 appt and sent back to his Urologist as he will have surgery on 8/6. Please f/u with patient.

## 2023-04-10 NOTE — Telephone Encounter (Signed)
Called patient and informed him he never brought paperwork to complete, and urology has not faxed [place] any forms to complete. He stated he will call his urologist to ask about the form.

## 2023-04-10 NOTE — Telephone Encounter (Signed)
Form received and given to Dr. Carlynn Purl for completion. Once signed I will fax.

## 2023-04-23 MED ORDER — GENTAMICIN SULFATE 40 MG/ML IJ SOLN
5.0000 mg/kg | INTRAVENOUS | Status: AC
  Administered 2023-04-24: 420 mg via INTRAVENOUS
  Filled 2023-04-23: qty 10.5

## 2023-04-24 ENCOUNTER — Encounter (HOSPITAL_COMMUNITY): Payer: Self-pay | Admitting: Urology

## 2023-04-24 ENCOUNTER — Encounter (HOSPITAL_COMMUNITY)
Admission: RE | Disposition: A | Discharge status: Home / Self Care | Payer: Self-pay | Source: Ambulatory Visit | Attending: Urology

## 2023-04-24 ENCOUNTER — Ambulatory Visit (HOSPITAL_COMMUNITY): Payer: BC Managed Care – PPO | Admitting: Certified Registered"

## 2023-04-24 ENCOUNTER — Ambulatory Visit (HOSPITAL_COMMUNITY)
Admission: RE | Admit: 2023-04-24 | Discharge: 2023-04-24 | Disposition: A | Discharge status: Home / Self Care | Payer: BC Managed Care – PPO | Source: Ambulatory Visit | Attending: Urology | Admitting: Urology

## 2023-04-24 ENCOUNTER — Other Ambulatory Visit: Payer: Self-pay

## 2023-04-24 DIAGNOSIS — T83490A Other mechanical complication of penile (implanted) prosthesis, initial encounter: Secondary | ICD-10-CM | POA: Diagnosis not present

## 2023-04-24 DIAGNOSIS — N529 Male erectile dysfunction, unspecified: Secondary | ICD-10-CM | POA: Insufficient documentation

## 2023-04-24 HISTORY — PX: PENILE PROSTHESIS IMPLANT: SHX240

## 2023-04-24 SURGERY — INSERTION, PENILE PROSTHESIS, INFLATABLE
Anesthesia: General

## 2023-04-24 MED ORDER — IRRISEPT - 450ML BOTTLE WITH 0.05% CHG IN STERILE WATER, USP 99.95% OPTIME
TOPICAL | Status: DC | PRN
  Administered 2023-04-24 (×3): 450 mL via TOPICAL

## 2023-04-24 MED ORDER — MIDAZOLAM HCL 5 MG/5ML IJ SOLN
INTRAMUSCULAR | Status: DC | PRN
  Administered 2023-04-24: 2 mg via INTRAVENOUS

## 2023-04-24 MED ORDER — OXYCODONE HCL 5 MG/5ML PO SOLN: 5.0000 mg | Freq: Once | ORAL | Status: AC | PRN

## 2023-04-24 MED ORDER — BUPIVACAINE HCL (PF) 0.5 % IJ SOLN
INTRAMUSCULAR | Status: AC
  Filled 2023-04-24: qty 30

## 2023-04-24 MED ORDER — LIDOCAINE 2% (20 MG/ML) 5 ML SYRINGE
INTRAMUSCULAR | Status: DC | PRN
  Administered 2023-04-24: 100 mg via INTRAVENOUS

## 2023-04-24 MED ORDER — FENTANYL CITRATE (PF) 100 MCG/2ML IJ SOLN
INTRAMUSCULAR | Status: AC
  Filled 2023-04-24: qty 2

## 2023-04-24 MED ORDER — MUPIROCIN 2 % EX OINT: 1.0000 | TOPICAL_OINTMENT | Freq: Once | CUTANEOUS | Status: DC

## 2023-04-24 MED ORDER — PROPOFOL 10 MG/ML IV BOLUS
INTRAVENOUS | Status: DC | PRN
Start: 2023-04-24 — End: 2023-04-24
  Administered 2023-04-24: 170 mg via INTRAVENOUS

## 2023-04-24 MED ORDER — DEXAMETHASONE SODIUM PHOSPHATE 10 MG/ML IJ SOLN
INTRAMUSCULAR | Status: DC | PRN
  Administered 2023-04-24: 10 mg via INTRAVENOUS

## 2023-04-24 MED ORDER — ONDANSETRON HCL 4 MG/2ML IJ SOLN: 4.0000 mg | Freq: Once | INTRAMUSCULAR | Status: DC | PRN

## 2023-04-24 MED ORDER — ORAL CARE MOUTH RINSE: 15.0000 mL | Freq: Once | OROMUCOSAL | Status: AC

## 2023-04-24 MED ORDER — CHLORHEXIDINE GLUCONATE 4 % EX SOLN: Freq: Once | CUTANEOUS | Status: DC

## 2023-04-24 MED ORDER — OXYCODONE HCL 5 MG PO TABS
5.0000 mg | ORAL_TABLET | Freq: Once | ORAL | Status: AC | PRN
  Administered 2023-04-24: 5 mg via ORAL

## 2023-04-24 MED ORDER — ACETAMINOPHEN 500 MG PO TABS: 1000.0000 mg | ORAL_TABLET | Freq: Four times a day (QID) | ORAL | 1 refills | Status: DC

## 2023-04-24 MED ORDER — LIDOCAINE HCL (PF) 1 % IJ SOLN
INTRAMUSCULAR | Status: AC
  Filled 2023-04-24: qty 30

## 2023-04-24 MED ORDER — SULFAMETHOXAZOLE-TRIMETHOPRIM 800-160 MG PO TABS: 1.0000 | ORAL_TABLET | Freq: Two times a day (BID) | ORAL | 0 refills | Status: DC

## 2023-04-24 MED ORDER — BUPIVACAINE HCL 0.5 % IJ SOLN
INTRAMUSCULAR | Status: DC | PRN
  Administered 2023-04-24: 5 mL

## 2023-04-24 MED ORDER — STERILE WATER FOR IRRIGATION IR SOLN
Status: DC | PRN
  Administered 2023-04-24: 500 mL

## 2023-04-24 MED ORDER — FLUCONAZOLE IN SODIUM CHLORIDE 200-0.9 MG/100ML-% IV SOLN
200.0000 mg | INTRAVENOUS | Status: DC
  Administered 2023-04-24: 200 mg via INTRAVENOUS
  Filled 2023-04-24: qty 100

## 2023-04-24 MED ORDER — OXYCODONE HCL 5 MG PO TABS
ORAL_TABLET | ORAL | Status: AC
  Filled 2023-04-24: qty 1

## 2023-04-24 MED ORDER — OXYCODONE HCL 10 MG PO TABS: 10.0000 mg | ORAL_TABLET | Freq: Four times a day (QID) | ORAL | 0 refills | Status: DC | PRN

## 2023-04-24 MED ORDER — MIDAZOLAM HCL 2 MG/2ML IJ SOLN
INTRAMUSCULAR | Status: AC
  Filled 2023-04-24: qty 2

## 2023-04-24 MED ORDER — ACETAMINOPHEN 10 MG/ML IV SOLN: 1000.0000 mg | Freq: Once | INTRAVENOUS | Status: DC | PRN

## 2023-04-24 MED ORDER — PROPOFOL 10 MG/ML IV BOLUS
INTRAVENOUS | Status: AC
  Filled 2023-04-24: qty 20

## 2023-04-24 MED ORDER — SODIUM CHLORIDE 0.9 % IR SOLN
Status: DC | PRN
  Administered 2023-04-24: 1000 mL

## 2023-04-24 MED ORDER — FENTANYL CITRATE PF 50 MCG/ML IJ SOSY: 25.0000 ug | PREFILLED_SYRINGE | INTRAMUSCULAR | Status: DC | PRN

## 2023-04-24 MED ORDER — CHLORHEXIDINE GLUCONATE 0.12 % MT SOLN
15.0000 mL | Freq: Once | OROMUCOSAL | Status: AC
  Administered 2023-04-24: 15 mL via OROMUCOSAL

## 2023-04-24 MED ORDER — OXYCODONE HCL 5 MG PO TABS: 5.0000 mg | ORAL_TABLET | Freq: Once | ORAL | Status: DC | PRN

## 2023-04-24 MED ORDER — OXYCODONE HCL 5 MG/5ML PO SOLN: 5.0000 mg | Freq: Once | ORAL | Status: DC | PRN

## 2023-04-24 MED ORDER — ONDANSETRON HCL 4 MG/2ML IJ SOLN
INTRAMUSCULAR | Status: DC | PRN
  Administered 2023-04-24: 4 mg via INTRAVENOUS

## 2023-04-24 MED ORDER — FENTANYL CITRATE (PF) 100 MCG/2ML IJ SOLN
INTRAMUSCULAR | Status: DC | PRN
  Administered 2023-04-24 (×3): 50 ug via INTRAVENOUS

## 2023-04-24 MED ORDER — CELECOXIB 200 MG PO CAPS: 200.0000 mg | ORAL_CAPSULE | Freq: Two times a day (BID) | ORAL | 1 refills | Status: AC

## 2023-04-24 MED ORDER — LIDOCAINE HCL 1 % IJ SOLN
INTRAMUSCULAR | Status: DC | PRN
  Administered 2023-04-24: 5 mL

## 2023-04-24 MED ORDER — LACTATED RINGERS IV SOLN: INTRAVENOUS | Status: DC

## 2023-04-24 MED ORDER — VANCOMYCIN HCL IN DEXTROSE 1-5 GM/200ML-% IV SOLN
1000.0000 mg | INTRAVENOUS | Status: AC
  Administered 2023-04-24: 1000 mg via INTRAVENOUS
  Filled 2023-04-24: qty 200

## 2023-04-24 SURGICAL SUPPLY — 57 items
ADH SKN CLS APL DERMABOND .7 (GAUZE/BANDAGES/DRESSINGS) ×1
APL PRP STRL LF DISP 70% ISPRP (MISCELLANEOUS) ×2
BAG DRN RND TRDRP ANRFLXCHMBR (UROLOGICAL SUPPLIES)
BAG URINE DRAIN 2000ML AR STRL (UROLOGICAL SUPPLIES) ×2 IMPLANT
BLADE SURG 15 STRL LF DISP TIS (BLADE) IMPLANT
BLADE SURG 15 STRL SS (BLADE)
BNDG GAUZE DERMACEA FLUFF 4 (GAUZE/BANDAGES/DRESSINGS) ×2 IMPLANT
BNDG GZE DERMACEA 4 6PLY (GAUZE/BANDAGES/DRESSINGS) ×2
BRIEF MESH DISP LRG (UNDERPADS AND DIAPERS) ×2 IMPLANT
CATH COUDE 5CC RIBBED (CATHETERS) ×2 IMPLANT
CATH RIBBED COUDE 5CC (CATHETERS) ×1
CHLORAPREP W/TINT 26 (MISCELLANEOUS) ×4 IMPLANT
COVER MAYO STAND STRL (DRAPES) ×2 IMPLANT
COVER SURGICAL LIGHT HANDLE (MISCELLANEOUS) ×2 IMPLANT
DERMABOND ADVANCED .7 DNX12 (GAUZE/BANDAGES/DRESSINGS) ×2 IMPLANT
DRAIN CHANNEL 10F 3/8 F FF (DRAIN) IMPLANT
DRAPE INCISE IOBAN 66X45 STRL (DRAPES) ×2 IMPLANT
DRAPE LAPAROTOMY T 98X78 PEDS (DRAPES) ×2 IMPLANT
DRSG TEGADERM 4X4.75 (GAUZE/BANDAGES/DRESSINGS) ×2 IMPLANT
ELECT REM PT RETURN 15FT ADLT (MISCELLANEOUS) ×2 IMPLANT
EVACUATOR SILICONE 100CC (DRAIN) ×2 IMPLANT
GLOVE BIO SURGEON STRL SZ7 (GLOVE) ×2 IMPLANT
GLOVE BIOGEL PI IND STRL 7.0 (GLOVE) ×2 IMPLANT
GOWN STRL REUS W/ TWL LRG LVL3 (GOWN DISPOSABLE) ×2 IMPLANT
GOWN STRL REUS W/TWL LRG LVL3 (GOWN DISPOSABLE) ×1
HOLDER FOLEY CATH W/STRAP (MISCELLANEOUS) ×2 IMPLANT
IMPL RTE STACKING CX LGX1.5 (Breast) IMPLANT
IMPL SNAPCONE RTE CX 2.0 IMPLANT
IMPLANT RTE STACKING CX LGX1.5 (Breast) ×1 IMPLANT
IMPLANT SNAPCONE RTE CX 2.0 ×1 IMPLANT
JET LAVAGE IRRISEPT WOUND (IRRIGATION / IRRIGATOR) ×3
KIT ACCESSORY AMS 700 PUMP (Urological Implant) IMPLANT
KIT BASIN OR (CUSTOM PROCEDURE TRAY) ×2 IMPLANT
KIT TURNOVER KIT A (KITS) IMPLANT
LAVAGE JET IRRISEPT WOUND (IRRIGATION / IRRIGATOR) IMPLANT
NDL HYPO 22X1.5 SAFETY MO (MISCELLANEOUS) ×2 IMPLANT
NEEDLE HYPO 22X1.5 SAFETY MO (MISCELLANEOUS) ×1 IMPLANT
NS IRRIG 1000ML POUR BTL (IV SOLUTION) ×2 IMPLANT
PACK GENERAL/GYN (CUSTOM PROCEDURE TRAY) ×2 IMPLANT
PLUG CATH AND CAP STRL 200 (CATHETERS) ×2 IMPLANT
PROS PENILE AMS 700 CX 24 (Urological Implant) ×1 IMPLANT
PROSTHESIS PENIL AMS 700 CX 24 (Urological Implant) IMPLANT
RESERVOIR FLAT IZ 100ML (Miscellaneous) IMPLANT
RETRACTOR DEEP SCROTAL PENILE (MISCELLANEOUS) IMPLANT
RETRACTOR WILSON SYSTEM (INSTRUMENTS) IMPLANT
SURGILUBE 2OZ TUBE FLIPTOP (MISCELLANEOUS) IMPLANT
SUT ETHILON 3 0 PS 1 (SUTURE) IMPLANT
SUT MNCRL AB 4-0 PS2 18 (SUTURE) ×2 IMPLANT
SUT VIC AB 2-0 UR6 27 (SUTURE) ×8 IMPLANT
SUT VIC AB 3-0 SH 27 (SUTURE) ×3
SUT VIC AB 3-0 SH 27X BRD (SUTURE) ×4 IMPLANT
SYR 10ML LL (SYRINGE) ×4 IMPLANT
SYR 50ML LL SCALE MARK (SYRINGE) ×6 IMPLANT
SYR CONTROL 10ML LL (SYRINGE) ×2 IMPLANT
TOWEL GREEN STERILE FF (TOWEL DISPOSABLE) ×2 IMPLANT
TOWEL OR 17X26 10 PK STRL BLUE (TOWEL DISPOSABLE) ×4 IMPLANT
WATER STERILE IRR 500ML POUR (IV SOLUTION) ×2 IMPLANT

## 2023-04-24 NOTE — Anesthesia Preprocedure Evaluation (Signed)
Anesthesia Evaluation  Patient identified by MRN, date of birth, ID band Patient awake    Reviewed: Allergy & Precautions, NPO status , Patient's Chart, lab work & pertinent test results, reviewed documented beta blocker date and time   History of Anesthesia Complications Negative for: history of anesthetic complications  Airway Mallampati: II  TM Distance: >3 FB     Dental no notable dental hx.    Pulmonary neg pulmonary ROS   breath sounds clear to auscultation       Cardiovascular METS(-) hypertension(-) angina (-) Past MI and (-) CABG negative cardio ROS  Rhythm:Regular Rate:Normal     Neuro/Psych negative neurological ROS     GI/Hepatic ,neg GERD  ,,(+) neg Cirrhosis        Endo/Other    Renal/GU Renal disease     Musculoskeletal   Abdominal   Peds  Hematology   Anesthesia Other Findings   Reproductive/Obstetrics                             Anesthesia Physical Anesthesia Plan  ASA: 2  Anesthesia Plan: General   Post-op Pain Management:    Induction: Intravenous  PONV Risk Score and Plan: 1 and Ondansetron  Airway Management Planned: LMA  Additional Equipment:   Intra-op Plan:   Post-operative Plan: Extubation in OR  Informed Consent: I have reviewed the patients History and Physical, chart, labs and discussed the procedure including the risks, benefits and alternatives for the proposed anesthesia with the patient or authorized representative who has indicated his/her understanding and acceptance.     Dental advisory given  Plan Discussed with:   Anesthesia Plan Comments:        Anesthesia Quick Evaluation

## 2023-04-24 NOTE — Anesthesia Procedure Notes (Signed)
Procedure Name: LMA Insertion Date/Time: 04/24/2023 7:36 AM  Performed by: Doran Clay, CRNAPre-anesthesia Checklist: Patient identified, Emergency Drugs available, Suction available, Patient being monitored and Timeout performed Patient Re-evaluated:Patient Re-evaluated prior to induction Oxygen Delivery Method: Circle system utilized Preoxygenation: Pre-oxygenation with 100% oxygen Induction Type: IV induction LMA: LMA inserted LMA Size: 4.0 Tube type: Oral Number of attempts: 1 Placement Confirmation: positive ETCO2 and breath sounds checked- equal and bilateral Tube secured with: Tape Dental Injury: Teeth and Oropharynx as per pre-operative assessment

## 2023-04-24 NOTE — Discharge Instructions (Addendum)
Penile prosthesis postoperative instructions  Wound:  In most cases your incision will have absorbable sutures that will dissolve within the first 10-20 days. Some will fall out even earlier. Expect some redness as the sutures dissolved but this should occur only around the sutures. If there is generalized redness, especially with increasing pain or swelling, let us know. The scrotum and penis will very likely get "black and blue" as the blood in the tissues spread. Sometimes the whole scrotum will turn colors. The black and blue is followed by a yellow and brown color. In time, all the discoloration will go away. In some cases some firm swelling in the area of the testicle and pump may persist for up to 4-6 weeks after the surgery and is considered normal in most cases.  Drain:   You may be discharged home with a drain in place. If so, you will be taught how to empty it and should keep track of the output. Additionally, you should call the office to arrange for an appointment to have it removed after a few days.   Diet:  You may return to your normal diet within 24 hours following your surgery. You may note some mild nausea and possibly vomiting the first 6-8 hours following surgery. This is usually due to the side effects of anesthesia, and will disappear quite soon. I would suggest clear liquids and a very light meal the first evening following your surgery.  Activity:  Your physical activity should be restricted the first 48 hours. During that time you should remain relatively inactive, moving about only when necessary. During the first 3 weeks following surgery you should avoid lifting any heavy objects (anything greater than 15 pounds), and avoid strenuous exercise. If you work, ask us specifically about your restrictions, both for work and home. We will write a note to your employer if needed.  Avoid using your penis until your follow up visit with Dr Machen, which will typically be around  3-4 weeks following the surgery. Most people are able to start cycling their device after that appointment, and can have intercourse soon thereafter.   You should plan to wear a tight pair of jockey shorts or an athletic supporter for the first 4-5 days, even to sleep. This will keep the scrotum immobilized to some degree and keep the swelling down.The position of your penis will determine what is most comfortable but I strongly urge you to keep the penis in the "up" position (toward your head). You should continue to tuck "up" your penis when possible for the first 3 months following surgery.  Ice packs should be placed on and off over the scrotum for the first 48 hours. Frozen peas or corn in a ZipLock bag can be frozen, used and re-frozen. Fifteen minutes on and 15 minutes off is a reasonable schedule. The ice is a good pain reliever and keeps the swelling down.  Hygiene:  You may shower 48 hours after your surgery. Tub bathing should be restricted until the wound is completely healed, typically around 2-3 weeks.  Medication:  You will be sent home with some type of pain medication. In many cases you will be sent home with a strong anti-inflammatory medication (Celebrex, Meloxicam) and a narcotic pain pill (hydrocodone or oxycodone). You can also supplement these medications with tylenol (acetaminophen). If the pain medication you are sent home with does not control the pain, please notify the office Problems you should report to us:  Fever of 101.0 degrees   Fahrenheit or greater. Moderate or severe swelling under the skin incision or involving the scrotum. Drug reaction such as hives, a rash, nausea or vomiting.  

## 2023-04-24 NOTE — Anesthesia Postprocedure Evaluation (Signed)
Anesthesia Post Note  Patient: Jeffrey Harding  Procedure(s) Performed: REMOVAL AND REPLACEMENT OF INFLATABLE PENILE PROTHESIS     Patient location during evaluation: PACU Anesthesia Type: General Level of consciousness: awake and alert Pain management: pain level controlled Vital Signs Assessment: post-procedure vital signs reviewed and stable Respiratory status: spontaneous breathing, nonlabored ventilation, respiratory function stable and patient connected to nasal cannula oxygen Cardiovascular status: blood pressure returned to baseline and stable Postop Assessment: no apparent nausea or vomiting Anesthetic complications: no   No notable events documented.  Last Vitals:  Vitals:   04/24/23 1045 04/24/23 1116  BP: 131/80 129/69  Pulse: 85 75  Resp: 14   Temp:  37.1 C  SpO2: 98% 100%    Last Pain:  Vitals:   04/24/23 1116  TempSrc:   PainSc: 3                  Mariann Barter

## 2023-04-24 NOTE — H&P (Signed)
H&P  History of Present Illness: Jeffrey Harding is a 62 y.o. year old M who presents today for removal of malfunctioning implant and reinsertion of an inflatable penile prosthesis  No acute complaints  Past Medical History:  Diagnosis Date   Allergy    Cancer (HCC) 2008   prostate   H/O prostatectomy    Hyperlipidemia    Over weight    Vitamin D deficiency     Past Surgical History:  Procedure Laterality Date   COLONOSCOPY     COLONOSCOPY WITH PROPOFOL N/A 01/13/2022   Procedure: COLONOSCOPY WITH PROPOFOL;  Surgeon: Wyline Mood, MD;  Location: [facility] ENDOSCOPY;  Service: Gastroenterology;  Laterality: N/A;   CYST EXCISION  09/19/2007   back   LASER OF PROSTATE W/ GREEN LIGHT PVP  09/18/2006   PENILE PROSTHESIS IMPLANT     PROSTATECTOMY     TONSILECTOMY/ADENOIDECTOMY WITH MYRINGOTOMY      Home Medications:  Current Meds  Medication Sig   acetaminophen (TYLENOL) 500 MG tablet Take 500 mg by mouth every 6 (six) hours as needed for moderate pain.   Ascorbic Acid (VITAMIN C PO) Take 1 capsule by mouth daily.   loratadine (CLARITIN) 10 MG tablet Take 10 mg by mouth daily as needed for allergies.   omeprazole (PRILOSEC) 40 MG capsule Take 1 capsule (40 mg total) by mouth daily before supper.   VITAMIN D PO Take 1 capsule by mouth daily.    Allergies: No Known Allergies  Family History  Problem Relation Age of Onset   Hypertension Mother    Hyperlipidemia Mother    Heart disease Mother    Diabetes Mother    Heart disease Father    Hyperlipidemia Father    Hypertension Father    Cancer Father        prostate   Hypertension Sister    Hyperlipidemia Sister    Lupus Sister    Hypertension Sister     Social History:  reports that he has never smoked. He has never used smokeless tobacco. He reports current alcohol use. He reports that he does not use drugs.  ROS: A complete review of systems was performed.  All systems are negative except for pertinent findings as  noted.  Physical Exam:  Vital signs in last 24 hours: Temp:  [98.5 F (36.9 C)] 98.5 F (36.9 C) (08/06 0548) Pulse Rate:  [85] 85 (08/06 0548) Resp:  [16] 16 (08/06 0548) BP: (153)/(94) 153/94 (08/06 0548) SpO2:  [97 %] 97 % (08/06 0548) Weight:  [83 kg] 83 kg (08/05 0800) Constitutional:  Alert and oriented, No acute distress Cardiovascular: Regular rate and rhythm Respiratory: Normal respiratory effort, Lungs clear bilaterally GI: Abdomen is soft, nontender, nondistended, no abdominal masses Lymphatic: No lymphadenopathy Neurologic: Grossly intact, no focal deficits Psychiatric: Normal mood and affect   Laboratory Data:  No results for input(s): "WBC", "HGB", "HCT", "PLT" in the last 72 hours.  No results for input(s): "NA", "K", "CL", "GLUCOSE", "BUN", "CALCIUM", "CREATININE" in the last 72 hours.  Invalid input(s): "CO3"   No results found for this or any previous visit (from the past 24 hour(s)). No results found for this or any previous visit (from the past 240 hour(s)).  Renal Function: No results for input(s): "CREATININE" in the last 168 hours. CrCl cannot be calculated (Patient's most recent lab result is older than the maximum 21 days allowed.).  Radiologic Imaging: No results found.  Assessment:  Jeffrey Harding is a 62 y.o. year old  M with ED refractory to other medical treatments, malfunctioning IPP  Plan:  To OR as planned for removal/replacement of IPP. Procedure and risks reviewed, including but not limited to bleeding, infection, implant infection, implant malfunction, implant malplacement, erosion, damage to adjacent structures, pain, urinary retention. Discussed possibility of "drain and retain" and associated risks. All questions answered   Irine Seal, MD 04/24/2023, 7:27 AM  [facility] Pager: [phone]

## 2023-04-24 NOTE — Op Note (Signed)
PATIENT:  Jeffrey Harding  PRE-OPERATIVE DIAGNOSIS:  Organic erectile dysfunction  POST-OPERATIVE DIAGNOSIS:  Same  PROCEDURE:   3 piece inflatable penile prosthesis (BS/AMS) Removal of malfunctioning IPP  SURGEON:  Irine Seal MD  ASST: Dr Jerald Kief, MD  INDICATION: He has had Emmitt-standing organic erectile dysfunction and refractory to other modes of treatment. He has elected to proceed with prosthesis implantation.  ANESTHESIA:  General  EBL:  Minimal  Device: 3 piece AMS CX 700: 100 cc reservoir, 24 cm cylinders and 3.5 cm rear-tip extenders on right and left sides  LOCAL MEDICATIONS USED:  None  SPECIMEN: None  DISPOSITION OF SPECIMEN:  N/A  Description of procedure: The patient was taken to the major operating room, placed on the table and administered general anesthesia in the supine position. His genitalia was then prepped with chlorhexidine x 2. He was draped in the usual sterile fashion, and I used [place] on the field. An official timeout was then performed.  A 14 French coude catheter was then placed in the bladder and the bladder was drained and the catheter was plugged. A midline penoscrotal incision was then made and the dissection was carried down to the corpora and urethra. The lonestar retractor was positioned so as to have excellent exposure. The pump was dissected free and delivered through the incision. We traced each set of tubing down to the corpora. The corpora were opened with the bovie and each cylinder was delivered. We then used cut current to dissect along the reservoir tubing. We made it to the external inguinal ring and still were not able to feel the actual reservoir beyond this. I elected to drain and retain the reservoir at this point.   2 2-0 vicryl stay sutures were placed on each side of each corporotomy (4 total on each). I then dilated the corpus cavernosum with the a 11 Fr brooks dilator distally and proximally. Field goal post tests  were performed and there was no evidence of perforations or crossover. I then irrigated the corpus cavernosum with antibiotic solution and measured the distance proximally and distally from the stay suture and was found to be 13 and 14.5 cm, respectively.I then turned my attention to the contralateral corpus cavernosum and placed my stay sutures, made my corporotomy and dilated the corpus cavernosum in an identical fashion. This was measured and also was found to be 13 cm proximally and 14.5 distally. It was irrigated with anastomotic solution as was the scrotum. I then chose an 24 cm cylinder set with 3.5 cm rear-tip extenders and these were prepped while I prepared the site for reservoir placement.  I then digitally probed into the right external inguinal ring. My finger was used to poke through the posterior wall of the ring. I used my finger to ensure I was in the appropriate space, and to clear room for the reservoir. I irrigated the space with anastomotic solution and then placed the reservoir in this location. I then filled the reservoir with 95 cc of sterile saline, and checked to confirm proper position. There was minimal backpressure with the reservoir max-filled.  Attention was redirected to the corporotomies where the cylinders were then placed by first fixing the suture to the distal aspect of the right cylinder to a straight needle. This was then loaded on the [facility] inserter and passed through the corporotomy and distally. I then advanced the straight needle with the Furlow inserter out through the glans and this was grasped with a hemostat and  pulled through the glans and the suture was secured with a hemostat. I then performed an identical maneuver on the contralateral side. After this was performed I irrigated both corpus cavernosum; there was no evidence of urethral perforation. I inserted the distal portion of the cylinder through the corporotomies and pulled this to the end of the corpora  with the suture. The proximal aspect with the rear-tip extender was then passed through the corporotomy and into the seated position on each side. I then connected reservoir tubing to a syringe filled with sterile saline and inflated the device. I noted a good straight erection with both cylinders equidistant under the glans and no buckling of the cylinders. I therefore deflated the device and closed the corporotomies with used my previously placed stay sutures.   I then grasped the scrotal skin in the midline with a babcock, and used a hemostat to dissect down to the dependent-most portion of the scrotum. The nasal speculum was inserted into this space, and facilitated placement of the pump. The cylinder was then connected to the pump after excising the excess tubing with appropriate shodded hemostats in place and then I used the supplied connectors to make the connection. I then again cycled the device with the pump and it cycled properly. I deflated the device and pumped it up about three quarters of the way to aid with hemostasis. I irrigated the wound one last time with antibiotic irrigation and then closed the deep scrotal tissue over the tubing and pump with running 3-0 monocryl suture. I placed a 10 Fr blake drain over the corporotomies. A second layer was then closed over this first layer with running 3- 0 monocryl, and running skin suture w/ 4-0 monocryl performed. Incision dressed with dermabond.  A mummy wrap was applied. The catheter was connected to closed system drainage, and drain connected to suction bulb and the patient was awakened and taken recovery room in stable and satisfactory condition. He tolerated the procedure well and there were no intraoperative complications. Needle sponge and instrument counts were correct at the end of the operation.

## 2023-04-24 NOTE — Transfer of Care (Signed)
Immediate Anesthesia Transfer of Care Note  Patient: Tonnie Kahle Tatro  Procedure(s) Performed: REMOVAL AND REPLACEMENT OF INFLATABLE PENILE PROTHESIS  Patient Location: PACU  Anesthesia Type:General  Level of Consciousness: sedated  Airway & Oxygen Therapy: Patient Spontanous Breathing and Patient connected to face mask oxygen  Post-op Assessment: Report given to RN and Post -op Vital signs reviewed and stable  Post vital signs: Reviewed and stable  Last Vitals:  Vitals Value Taken Time  BP 146/79 04/24/23 1010  Temp    Pulse 86 04/24/23 1011  Resp 21 04/24/23 1011  SpO2 96 % 04/24/23 1011  Vitals shown include unfiled device data.  Last Pain:  Vitals:   04/24/23 0615  TempSrc:   PainSc: 0-No pain         Complications: No notable events documented.

## 2023-04-25 ENCOUNTER — Encounter (HOSPITAL_COMMUNITY): Payer: Self-pay | Admitting: Urology

## 2023-05-25 DIAGNOSIS — N529 Male erectile dysfunction, unspecified: Secondary | ICD-10-CM | POA: Diagnosis not present

## 2023-06-21 IMAGING — CR DG HIP (WITH OR WITHOUT PELVIS) 2-3V*R*
3 series · 3 of 3 positions shown · non-contrast
Comparison: None.

CLINICAL DATA: Right hip pain for 3 days

EXAM:
DG HIP (WITH OR WITHOUT PELVIS) 2-3V RIGHT

[pelvis ap]
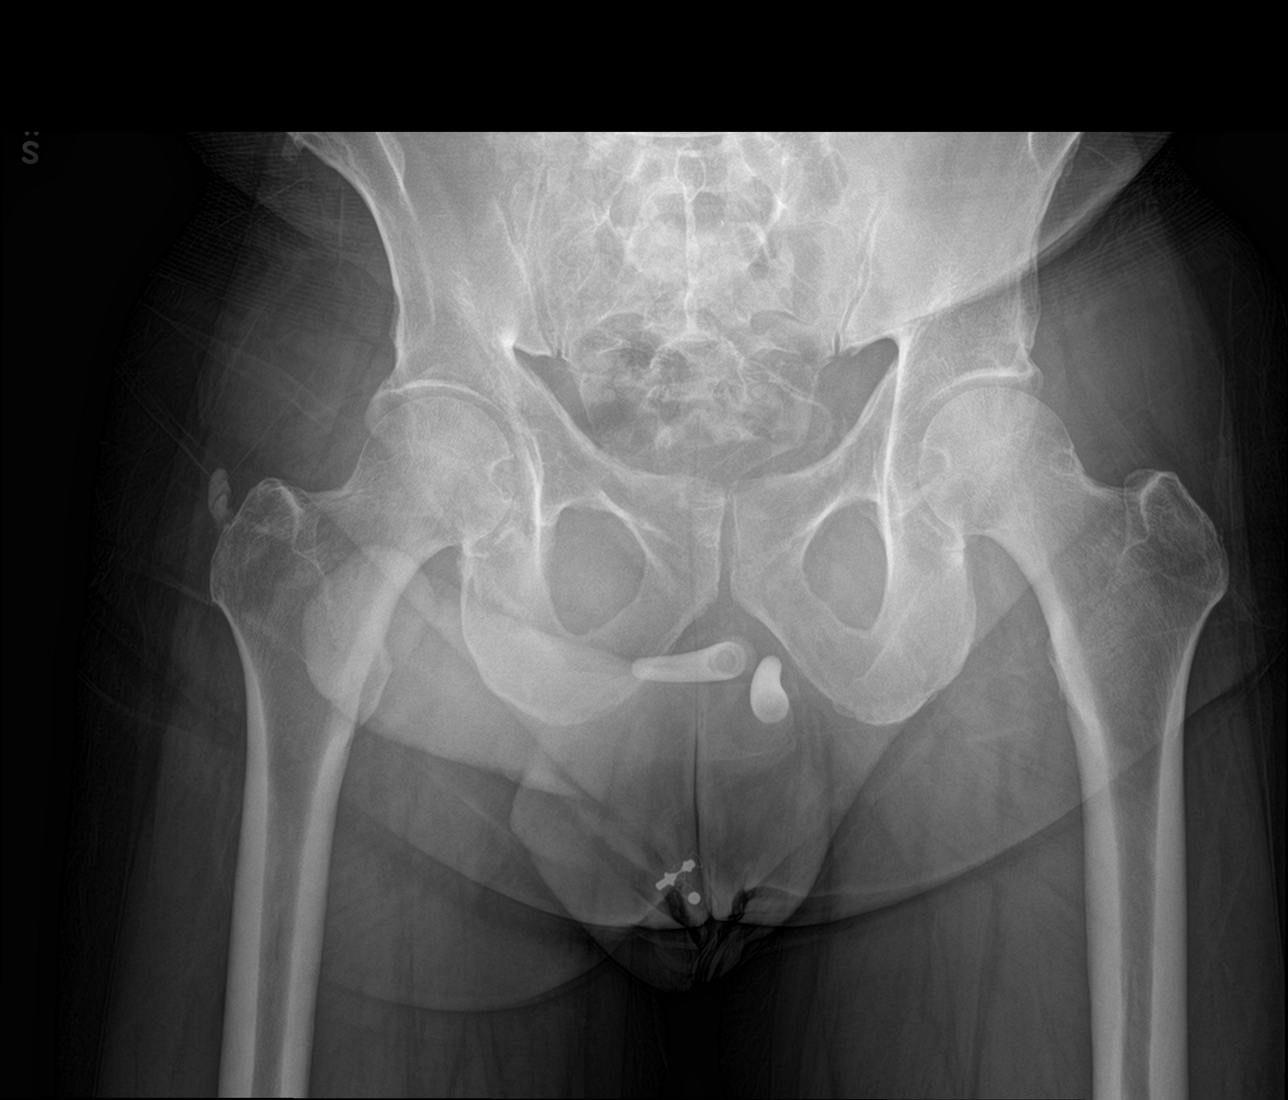

[hip ap]
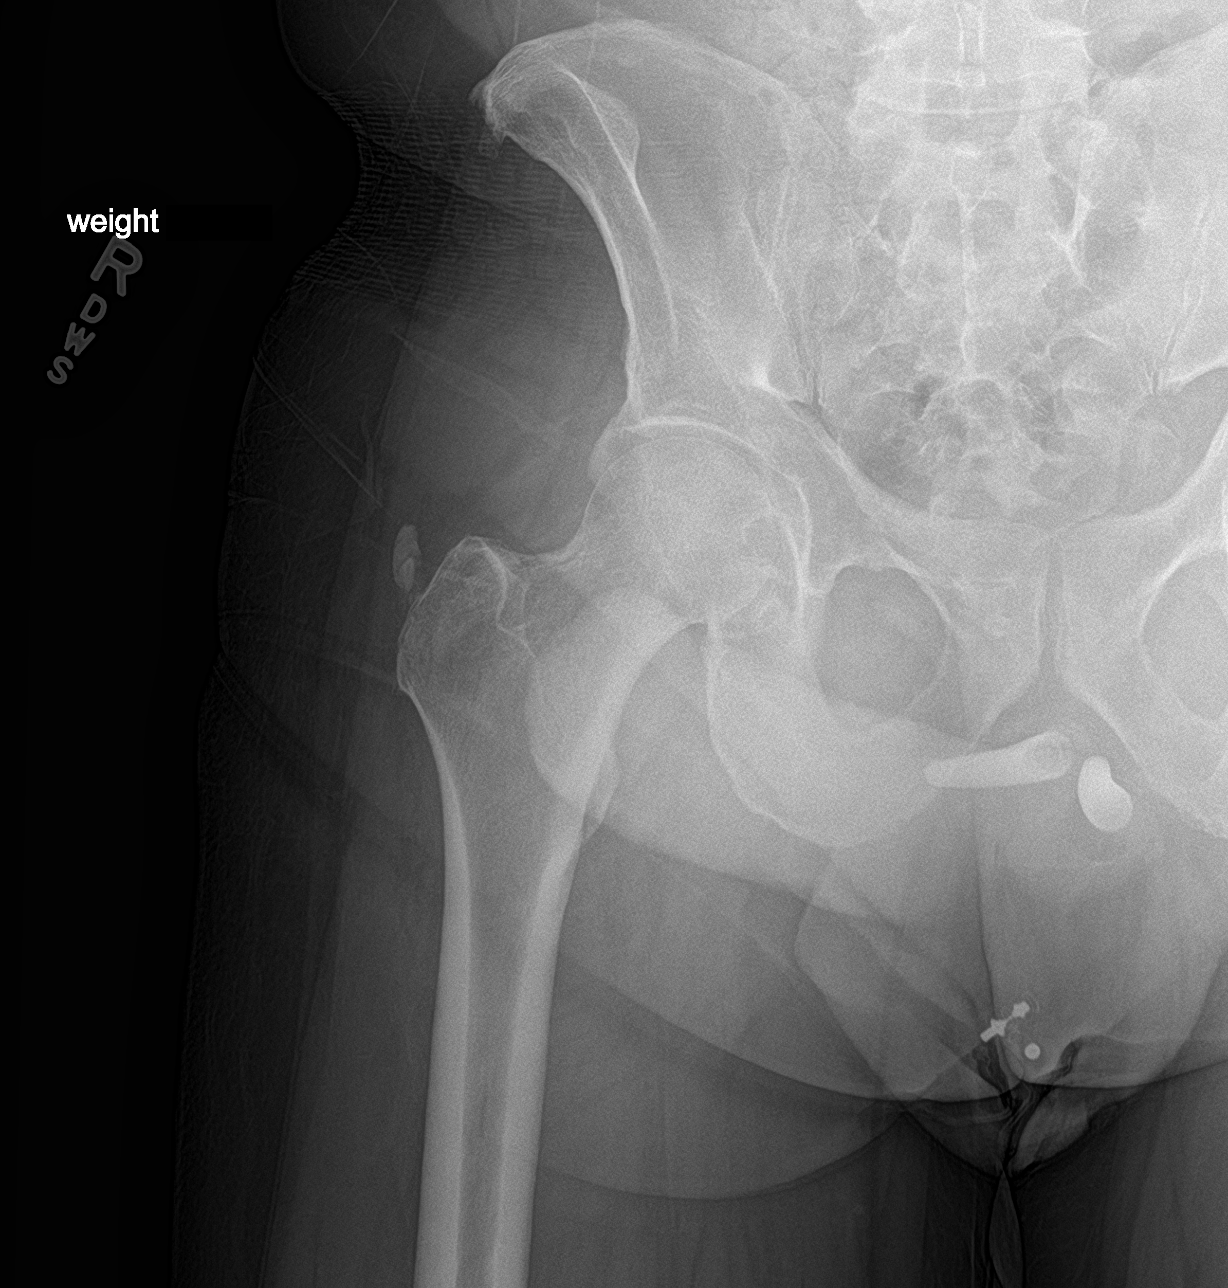

[hip lat]
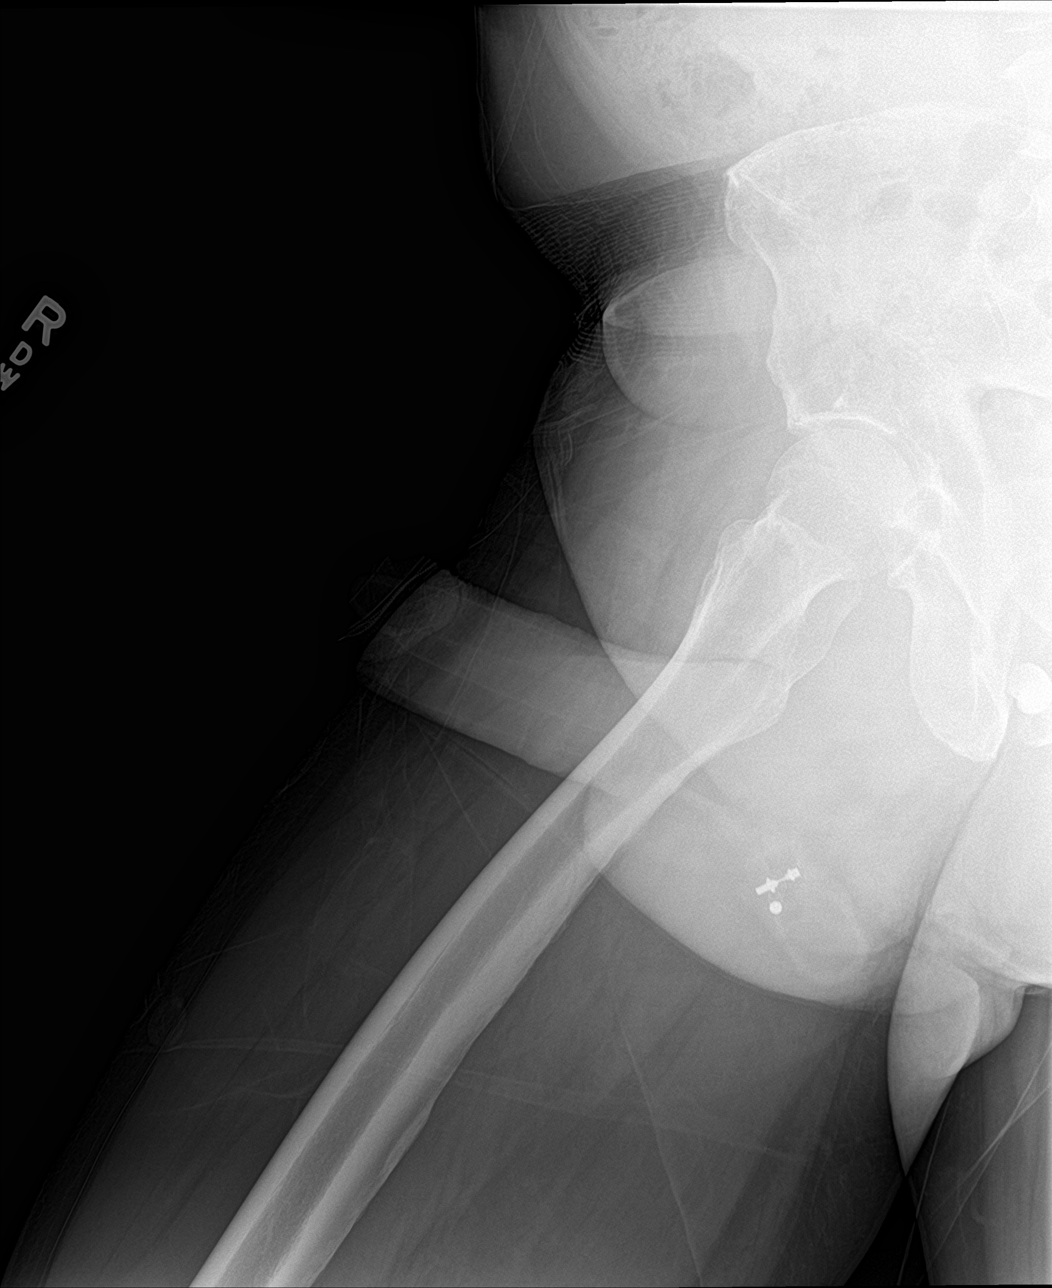

[3 of 3 positions shown; findings below may reference images not displayed]

FINDINGS: Hazy calcification lateral to the greater trochanter of the right
femur. Mild acetabular spurring on the right. No evidence of
fracture or bone lesion.
IMPRESSION: Hazy calcification lateral to the right greater trochanter that is
compatible with calcific tendinitis in the appropriate clinical
setting.

## 2023-06-30 ENCOUNTER — Other Ambulatory Visit: Payer: Self-pay | Admitting: Family Medicine

## 2023-06-30 DIAGNOSIS — K219 Gastro-esophageal reflux disease without esophagitis: Secondary | ICD-10-CM

## 2023-08-31 DIAGNOSIS — R748 Abnormal levels of other serum enzymes: Secondary | ICD-10-CM | POA: Diagnosis not present

## 2023-12-07 ENCOUNTER — Encounter: Payer: Self-pay | Admitting: Family Medicine

## 2023-12-21 ENCOUNTER — Encounter: Payer: Self-pay | Admitting: Family Medicine

## 2023-12-21 ENCOUNTER — Ambulatory Visit (INDEPENDENT_AMBULATORY_CARE_PROVIDER_SITE_OTHER): Admitting: Family Medicine

## 2023-12-21 VITALS — BP 136/84 | HR 82 | Resp 16 | Ht 69.0 in | Wt 188.9 lb

## 2023-12-21 DIAGNOSIS — Z9079 Acquired absence of other genital organ(s): Secondary | ICD-10-CM

## 2023-12-21 DIAGNOSIS — Z0000 Encounter for general adult medical examination without abnormal findings: Secondary | ICD-10-CM

## 2023-12-21 DIAGNOSIS — E785 Hyperlipidemia, unspecified: Secondary | ICD-10-CM | POA: Diagnosis not present

## 2023-12-21 DIAGNOSIS — R7303 Prediabetes: Secondary | ICD-10-CM

## 2023-12-21 DIAGNOSIS — Z125 Encounter for screening for malignant neoplasm of prostate: Secondary | ICD-10-CM | POA: Diagnosis not present

## 2023-12-21 DIAGNOSIS — E559 Vitamin D deficiency, unspecified: Secondary | ICD-10-CM | POA: Diagnosis not present

## 2023-12-21 DIAGNOSIS — L723 Sebaceous cyst: Secondary | ICD-10-CM

## 2023-12-21 NOTE — Progress Notes (Signed)
 Name: Jeffrey Harding   MRN: [id number]    DOB: 06-12-1961   Date:12/21/2023       Progress Note  Subjective  Chief Complaint  Chief Complaint  Patient presents with   Annual Exam    HPI  Patient presents for annual CPE .   IPSS     Row Name 12/21/23 1311         International Prostate Symptom Score   How often have you had the sensation of not emptying your bladder? Not at All     How often have you had to urinate less than every two hours? Not at All     How often have you found you stopped and started again several times when you urinated? Not at All     How often have you found it difficult to postpone urination? Not at All     How often have you had a weak urinary stream? Not at All     How often have you had to strain to start urination? Not at All     How many times did you typically get up at night to urinate? None     Total IPSS Score 0       Quality of Life due to urinary symptoms   If you were to spend the rest of your life with your urinary condition just the way it is now how would you feel about that? Unhappy              Diet: he does not cook at home,  eats out most meals - he eats at the [employer] for breakfast - eggs, grits and grilled chicken , for his second meals of the day usually salad with grilled chicken and bake potatoes - usually at [employer]  Exercise: discussed regular physical activity  Last Dental Exam: he needs to schedule it  Last Eye Exam: he needs an appointment   Depression: phq 9 is negative    12/21/2023    1:10 PM 04/06/2023    9:25 AM 01/05/2023   11:11 AM 12/08/2022    8:06 AM 12/08/2022    8:05 AM  Depression screen PHQ 2/9  Decreased Interest 0 0 0 0 0  Down, Depressed, Hopeless 0 0 0 0 0  PHQ - 2 Score 0 0 0 0 0  Altered sleeping 0 0 0 0   Tired, decreased energy 0 0 0 0   Change in appetite 0 0 0 0   Feeling bad or failure about yourself  0 0 0 0   Trouble concentrating 0 0 0 0   Moving slowly or  fidgety/restless 0 0 0 0   Suicidal thoughts 0 0 0 0   PHQ-9 Score 0 0 0 0   Difficult doing work/chores Not difficult at all   Not difficult at all     Hypertension:  BP Readings from Last 3 Encounters:  12/21/23 136/84  04/24/23 129/69  04/06/23 (!) 154/85    Obesity: Wt Readings from Last 3 Encounters:  12/21/23 188 lb 14.4 oz (85.7 kg)  04/23/23 182 lb 15.7 oz (83 kg)  04/06/23 183 lb 6.4 oz (83.2 kg)   BMI Readings from Last 3 Encounters:  12/21/23 27.90 kg/m  04/23/23 27.02 kg/m  04/06/23 27.08 kg/m     Flowsheet Row Office Visit from 12/21/2023 in [facility]  AUDIT-C Score 1        Divorced STD testing and prevention (  HIV/chl/gon/syphilis):  not applicable Sexual history: not currently sexually active  Hep C Screening: completed Skin cancer: Discussed monitoring for atypical lesions Colorectal cancer: up to date  Prostate cancer:  yes Lab Results  Component Value Date   PSA <0.04 12/08/2022   PSA <0.04 12/02/2021   PSA <0.04 11/26/2020     Lung cancer:  Low Dose CT Chest recommended if Age 15-80 years, 30 pack-year currently smoking OR have quit w/in 15years. Patient  is not a candidate for screening   AAA: The USPSTF recommends one-time screening with ultrasonography in men ages 39 to 75 years who have ever smoked. Patient   is not a candidate for screening  ECG:  2016  Vaccines: reviewed with the patient.   Advanced Care Planning: A voluntary discussion about advance care planning including the explanation and discussion of advance directives.  Discussed health care proxy and Living will, and the patient was able to identify a health care proxy as daugther- Courtneee .  Patient does not have a living will and power of attorney of health care   Patient Active Problem List   Diagnosis Date Noted   Elevated CK 12/02/2021   Perennial allergic rhinitis with seasonal variation 06/18/2015   H/O malignant neoplasm of prostate  06/18/2015   History of prostatectomy 06/18/2015   Vitamin D deficiency 06/18/2015   Dyslipidemia 06/18/2015   ED (erectile dysfunction) of organic origin 05/23/2012    Past Surgical History:  Procedure Laterality Date   COLONOSCOPY     COLONOSCOPY WITH PROPOFOL N/A 01/13/2022   Procedure: COLONOSCOPY WITH PROPOFOL;  Surgeon: Wyline Mood, MD;  Location: [facility] ENDOSCOPY;  Service: Gastroenterology;  Laterality: N/A;   CYST EXCISION  09/19/2007   back   LASER OF PROSTATE W/ GREEN LIGHT PVP  09/18/2006   PENILE PROSTHESIS IMPLANT     PENILE PROSTHESIS IMPLANT N/A 04/24/2023   Procedure: REMOVAL AND REPLACEMENT OF INFLATABLE PENILE PROTHESIS;  Surgeon: Despina Arias, MD;  Location: [facility] ORS;  Service: Urology;  Laterality: N/A;  135 MINUTES NEEDED FOR CASE   PROSTATECTOMY     TONSILECTOMY/ADENOIDECTOMY WITH MYRINGOTOMY      Family History  Problem Relation Age of Onset   Hypertension Mother    Hyperlipidemia Mother    Heart disease Mother    Diabetes Mother    Heart disease Father    Hyperlipidemia Father    Hypertension Father    Prostate cancer Father    Hypertension Sister    Hyperlipidemia Sister    Lupus Sister    Hypertension Sister     Social History   Socioeconomic History   Marital status: Divorced    Spouse name: Not on file   Number of children: 1   Years of education: 12   Highest education level: 12th grade  Occupational History   Occupation: truck Hospital doctor  Tobacco Use   Smoking status: Never   Smokeless tobacco: Never  Vaping Use   Vaping status: Never Used  Substance and Sexual Activity   Alcohol use: Yes    Comment: rarely   Drug use: No   Sexual activity: Not Currently    Partners: Female  Other Topics Concern   Not on file  Social History Narrative   Lives alone, 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    Social Drivers of Corporate investment banker Strain: Low Risk  (12/17/2023)   Overall Financial Resource Strain  (CARDIA)    Difficulty of Paying Living  Expenses: Not hard at all  Food Insecurity: No Food Insecurity (12/17/2023)   Hunger Vital Sign    Worried About Running Out of Food in the Last Year: Never true    Ran Out of Food in the Last Year: Never true  Transportation Needs: No Transportation Needs (12/17/2023)   PRAPARE - Administrator, Civil Service (Medical): No    Lack of Transportation (Non-Medical): No  Physical Activity: Insufficiently Active (12/17/2023)   Exercise Vital Sign    Days of Exercise per Week: 3 days    Minutes of Exercise per Session: 30 min  Stress: No Stress Concern Present (12/17/2023)   [employer] of Occupational Health - Occupational Stress Questionnaire    Feeling of Stress : Not at all  Social Connections: Moderately Integrated (12/17/2023)   Social Connection and Isolation Panel [NHANES]    Frequency of Communication with Friends and Family: Twice a week    Frequency of Social Gatherings with Friends and Family: Once a week    Attends Religious Services: More than 4 times per year    Active Member of [employer] or Organizations: Yes    Attends Engineer, structural: More than 4 times per year    Marital Status: Divorced  Intimate Partner Violence: Not At Risk (12/21/2023)   Humiliation, Afraid, Rape, and Kick questionnaire    Fear of Current or Ex-Partner: No    Emotionally Abused: No    Physically Abused: No    Sexually Abused: No     Current Outpatient Medications:    Ascorbic Acid (VITAMIN C PO), Take 1 capsule by mouth daily., Disp: , Rfl:    loratadine (CLARITIN) 10 MG tablet, Take 10 mg by mouth daily as needed for allergies., Disp: , Rfl:    VITAMIN D PO, Take 1 capsule by mouth daily., Disp: , Rfl:    sulfamethoxazole-trimethoprim (BACTRIM DS) 800-160 MG tablet, Take 1 tablet by mouth 2 (two) times daily., Disp: 14 tablet, Rfl: 0  No Known Allergies   ROS  Constitutional: Negative for fever or weight change.  Respiratory:  Negative for cough and shortness of breath.   Cardiovascular: Negative for chest pain or palpitations.  Gastrointestinal: Negative for abdominal pain, no bowel changes.  Musculoskeletal: Negative for gait problem or joint swelling.  Skin: Negative for rash.  Neurological: Negative for dizziness or headache.  No other specific complaints in a complete review of systems (except as listed in HPI above).    Objective  Vitals:   12/21/23 1312  BP: 136/84  Pulse: 82  Resp: 16  SpO2: 96%  Weight: 188 lb 14.4 oz (85.7 kg)  Height: 5\' 9"  (1.753 m)    Body mass index is 27.9 kg/m.  Physical Exam  Constitutional: Patient appears well-developed and well-nourished. No distress.  HENT: Head: Normocephalic and atraumatic. Ears: B TMs ok, no erythema or effusion; Nose: Nose normal. Mouth/Throat: Oropharynx is clear and moist. No oropharyngeal exudate.  Eyes: Conjunctivae and EOM are normal. Pupils are equal, round, and reactive to light. No scleral icterus.  Neck: Normal range of motion. Neck supple. No JVD present. No thyromegaly present.  Cardiovascular: Normal rate, regular rhythm and normal heart sounds.  No murmur heard. No BLE edema. Pulmonary/Chest: Effort normal and breath sounds normal. No respiratory distress. Abdominal: Soft. Bowel sounds are normal, no distension. There is no tenderness. no masses MALE GENITALIA: penile prosthesis  RECTAL: not done, history of prostatectomy  Musculoskeletal: Normal range of motion, no joint effusions. No gross  deformities Neurological: he is alert and oriented to person, place, and time. No cranial nerve deficit. Coordination, balance, strength, speech and gait are normal.  Skin: Skin is warm and dry. No rash noted. No erythema. He has a large sebaceous cyst on his mid back, growing lately and causing some discomfort  Psychiatric: Patient has a normal mood and affect. behavior is normal. Judgment and thought content normal.     Assessment &  Plan  1. Well adult exam (Primary)  - PSA - Lipid panel - Comprehensive metabolic panel with GFR - CBC with Differential/Platelet - Hemoglobin A1c - VITAMIN D 25 Hydroxy (Vit-D Deficiency, Fractures)  2. History of prostatectomy   3. Screening for prostate cancer  - PSA  4. Pre-diabetes  - Hemoglobin A1c  5. Dyslipidemia  - Lipid panel  6. Vitamin D deficiency  - VITAMIN D 25 Hydroxy (Vit-D Deficiency, Fractures)  7. Sebaceous cyst  - Ambulatory referral to General Surgery   -Prostate cancer screening and PSA options (with potential risks and benefits of testing vs not testing) were discussed along with recent recs/guidelines. -USPSTF grade A and B recommendations reviewed with patient; age-appropriate recommendations, preventive care, screening tests, etc discussed and encouraged; healthy living encouraged; see AVS for patient education given to patient -Discussed importance of 150 minutes of physical activity weekly, eat two servings of fish weekly, eat one serving of tree nuts ( cashews, pistachios, pecans, almonds.Marland Kitchen) every other day, eat 6 servings of fruit/vegetables daily and drink plenty of water and avoid sweet beverages.  -Reviewed Health Maintenance: yes

## 2023-12-22 LAB — HEMOGLOBIN A1C
Hgb A1c MFr Bld: 5.9 %{Hb} — ABNORMAL HIGH (ref <5.7)
Mean Plasma Glucose: 123 mg/dL
eAG (mmol/L): 6.8 mmol/L

## 2023-12-22 LAB — LIPID PANEL
Cholesterol: 185 mg/dL (ref <200)
HDL: 40 mg/dL (ref >=40)
LDL Cholesterol (Calc): 128 mg/dL — ABNORMAL HIGH
Non-HDL Cholesterol (Calc): 145 mg/dL — ABNORMAL HIGH (ref <130)
Total CHOL/HDL Ratio: 4.6 (calc) (ref <5.0)
Triglycerides: 77 mg/dL (ref <150)

## 2023-12-22 LAB — CBC WITH DIFFERENTIAL/PLATELET
Absolute Lymphocytes: 1955 {cells}/uL (ref 850–3900)
Absolute Monocytes: 443 {cells}/uL (ref 200–950)
Basophils Absolute: 32 {cells}/uL (ref 0–200)
Basophils Relative: 0.6 %
Eosinophils Absolute: 59 {cells}/uL (ref 15–500)
Eosinophils Relative: 1.1 %
HCT: 42.1 % (ref 38.5–50.0)
Hemoglobin: 14 g/dL (ref 13.2–17.1)
MCH: 30.9 pg (ref 27.0–33.0)
MCHC: 33.3 g/dL (ref 32.0–36.0)
MCV: 92.9 fL (ref 80.0–100.0)
MPV: 11.3 fL (ref 7.5–12.5)
Monocytes Relative: 8.2 %
Neutro Abs: 2911 {cells}/uL (ref 1500–7800)
Neutrophils Relative %: 53.9 %
Platelets: 210 10*3/uL (ref 140–400)
RBC: 4.53 10*6/uL (ref 4.20–5.80)
RDW: 13.3 % (ref 11.0–15.0)
Total Lymphocyte: 36.2 %
WBC: 5.4 10*3/uL (ref 3.8–10.8)

## 2023-12-22 LAB — COMPREHENSIVE METABOLIC PANEL WITH GFR
AG Ratio: 1.6 (calc) (ref 1.0–2.5)
ALT: 23 U/L (ref 9–46)
AST: 19 U/L (ref 10–35)
Albumin: 4.2 g/dL (ref 3.6–5.1)
Alkaline phosphatase (APISO): 66 U/L (ref 35–144)
BUN: 12 mg/dL (ref 7–25)
CO2: 28 mmol/L (ref 20–32)
Calcium: 8.9 mg/dL (ref 8.6–10.3)
Chloride: 103 mmol/L (ref 98–110)
Creat: 0.99 mg/dL (ref 0.70–1.35)
Globulin: 2.6 g/dL (ref 1.9–3.7)
Glucose, Bld: 89 mg/dL (ref 65–99)
Potassium: 3.9 mmol/L (ref 3.5–5.3)
Sodium: 138 mmol/L (ref 135–146)
Total Bilirubin: 0.8 mg/dL (ref 0.2–1.2)
Total Protein: 6.8 g/dL (ref 6.1–8.1)
eGFR: 86 mL/min/{1.73_m2} (ref >=60)

## 2023-12-22 LAB — VITAMIN D 25 HYDROXY (VIT D DEFICIENCY, FRACTURES): Vit D, 25-Hydroxy: 33 ng/mL (ref 30–100)

## 2023-12-22 LAB — PSA: PSA: 0.04 ng/mL (ref <=4.00)

## 2023-12-24 ENCOUNTER — Encounter: Payer: Self-pay | Admitting: Family Medicine

## 2023-12-25 ENCOUNTER — Other Ambulatory Visit: Payer: Self-pay | Admitting: Family Medicine

## 2023-12-25 DIAGNOSIS — K219 Gastro-esophageal reflux disease without esophagitis: Secondary | ICD-10-CM

## 2024-01-25 ENCOUNTER — Ambulatory Visit: Admitting: Surgery

## 2024-02-29 DIAGNOSIS — R748 Abnormal levels of other serum enzymes: Secondary | ICD-10-CM | POA: Diagnosis not present

## 2024-03-24 ENCOUNTER — Telehealth: Payer: Self-pay

## 2024-03-24 NOTE — Telephone Encounter (Signed)
 Copied from CRM [id number]. Topic: Medical Record Request - Attorney/Litigation >> Mar 24, 2024 12:04 PM DeAngela L wrote: Reason for CRM: Aolanis Crispen calling with [facility] to ask if the office could send medical records the practice states they sent a medical records request on 08/23/23 and have not received a response and calling to check the status or ask for a follow up from the office (For a mass tort litigation for a [employer])  Law office states they have aleady paid an invoice to [facility] health medical group health information management department - the invoice number is [id number] Callback number Eleanor [phone]  fax number [phone]  Route to Practice Administrator. >> Mar 24, 2024 12:58 PM Eleanor HERO wrote: Printed request out of chart and faxed to medical records in [place] on 03/24/24

## 2024-03-24 NOTE — Telephone Encounter (Signed)
 Printed off medical release from law firm from December and faxed to medical records in [place]

## 2024-04-04 ENCOUNTER — Ambulatory Visit (INDEPENDENT_AMBULATORY_CARE_PROVIDER_SITE_OTHER): Admitting: Family Medicine

## 2024-04-04 ENCOUNTER — Encounter: Payer: Self-pay | Admitting: Surgery

## 2024-04-04 ENCOUNTER — Encounter: Payer: Self-pay | Admitting: Family Medicine

## 2024-04-04 ENCOUNTER — Ambulatory Visit: Payer: Self-pay | Admitting: Surgery

## 2024-04-04 VITALS — BP 154/92 | HR 62 | Temp 98.2°F | Ht 69.0 in | Wt 180.2 lb

## 2024-04-04 VITALS — BP 122/80 | HR 83 | Resp 16 | Ht 69.0 in | Wt 180.7 lb

## 2024-04-04 DIAGNOSIS — K219 Gastro-esophageal reflux disease without esophagitis: Secondary | ICD-10-CM

## 2024-04-04 DIAGNOSIS — E559 Vitamin D deficiency, unspecified: Secondary | ICD-10-CM

## 2024-04-04 DIAGNOSIS — R7303 Prediabetes: Secondary | ICD-10-CM | POA: Diagnosis not present

## 2024-04-04 DIAGNOSIS — L723 Sebaceous cyst: Secondary | ICD-10-CM

## 2024-04-04 DIAGNOSIS — E785 Hyperlipidemia, unspecified: Secondary | ICD-10-CM | POA: Diagnosis not present

## 2024-04-04 DIAGNOSIS — Z9079 Acquired absence of other genital organ(s): Secondary | ICD-10-CM | POA: Diagnosis not present

## 2024-04-04 DIAGNOSIS — Z8546 Personal history of malignant neoplasm of prostate: Secondary | ICD-10-CM

## 2024-04-04 NOTE — Progress Notes (Signed)
 04/04/2024  Reason for Visit:  Sebaceous cyst of the back  Requesting Provider:  Dorette Loron, MD  History of Present Illness: Jeffrey Harding is a 63 y.o. male presenting for evaluation of a sebaceous cyst.  The patient reports that he's had this for many years, likely 10-15.  He reports that a Grass[place] time ago it drained spontaneously but otherwise has not had issues with it.  Reports that it's been stable in size.  Does not cause pain or discomfort, has not had recent drainage or tenderness.  Denies any other areas of cysts or masses.  Past Medical History: Past Medical History:  Diagnosis Date   Allergy    Cancer (HCC) 2008   prostate   H/O prostatectomy    Hyperlipidemia    Over weight    Vitamin D  deficiency      Past Surgical History: Past Surgical History:  Procedure Laterality Date   COLONOSCOPY     COLONOSCOPY WITH PROPOFOL  N/A 01/13/2022   Procedure: COLONOSCOPY WITH PROPOFOL ;  Surgeon: Therisa Bi, MD;  Location: [facility] ENDOSCOPY;  Service: Gastroenterology;  Laterality: N/A;   CYST EXCISION  09/19/2007   back   LASER OF PROSTATE W/ GREEN LIGHT PVP  09/18/2006   PENILE PROSTHESIS IMPLANT     PENILE PROSTHESIS IMPLANT N/A 04/24/2023   Procedure: REMOVAL AND REPLACEMENT OF INFLATABLE PENILE PROTHESIS;  Surgeon: Lovie Arlyss CROME, MD;  Location: [facility] ORS;  Service: Urology;  Laterality: N/A;  135 MINUTES NEEDED FOR CASE   PROSTATECTOMY     TONSILECTOMY/ADENOIDECTOMY WITH MYRINGOTOMY      Home Medications: Prior to Admission medications   Medication Sig Start Date End Date Taking? Authorizing Provider  Ascorbic Acid (VITAMIN C  PO) Take 1 capsule by mouth daily.   Yes [provider]  loratadine (CLARITIN) 10 MG tablet Take 10 mg by mouth daily as needed for allergies.   Yes [provider]  VITAMIN D  PO Take 1 capsule by mouth daily.   Yes [provider]    Allergies: No Known Allergies  Social History:  reports that he has never smoked. He  has never used smokeless tobacco. He reports current alcohol use. He reports that he does not use drugs.   Family History: Family History  Problem Relation Age of Onset   Hypertension Mother    Hyperlipidemia Mother    Heart disease Mother    Diabetes Mother    Heart disease Father    Hyperlipidemia Father    Hypertension Father    Prostate cancer Father    Hypertension Sister    Hyperlipidemia Sister    Lupus Sister    Hypertension Sister     Review of Systems: Review of Systems  Constitutional:  Negative for chills and fever.  Respiratory:  Negative for shortness of breath.   Cardiovascular:  Negative for chest pain.  Gastrointestinal:  Negative for nausea and vomiting.  Skin:        Sebaceous cyst in the back    Physical Exam BP (!) 154/92   Pulse 62   Temp 98.2 F (36.8 C) (Oral)   Ht 5' 9 (1.753 m)   Wt 180 lb 3.2 oz (81.7 kg)   SpO2 99%   BMI 26.61 kg/m  CONSTITUTIONAL: No acute distress HEENT:  Normocephalic, atraumatic, extraocular motion intact. RESPIRATORY:  Normal respiratory effort without pathologic use of accessory muscles. CARDIOVASCULAR: Regular rhythm and rate. MUSCULOSKELETAL:  Normal muscle strength and tone in all four extremities.  No peripheral edema or  cyanosis. SKIN: The patient has a 3.5 x 2.5 sebaceous cyst in the mid back with an entry skin pore visible.  No active drainage, no erythema, no induration.  It is soft, and nontender. NEUROLOGIC:  Motor and sensation is grossly normal.  Cranial nerves are grossly intact. PSYCH:  Alert and oriented to person, place and time. Affect is normal.  Laboratory Analysis: No results found for this or any previous visit (from the past 24 hours).  Imaging: No results found.  Assessment and Plan: This is a 63 y.o. male with a sebaceous cyst in the back.  - Discussed with patient the findings on exam that this is truly a sebaceous cyst.  Discussed with him that this is a benign mass.  It has drained  in the past likely from a prior infection, but I think is reasonable to proceed with excision to prevent further recurrences.  The patient is in agreement with this. - Discussed with him then the plan for an excision of a back sebaceous cyst.  Reviewed the procedure at length with him including the use of local anesthesia, that this would be an office procedure, planned incision, risks of bleeding, infection, injury to surrounding structures, postoperative activity restrictions, pain control, and he is willing to proceed. - We will schedule her for an office procedure on 04/16/2024.  All of his questions have been answered.  I spent 30 minutes dedicated to the care of this patient on the date of this encounter to include pre-visit review of records, face-to-face time with the patient discussing diagnosis and management, and any post-visit coordination of care.   Aloysius Sheree Plant, MD [facility]

## 2024-04-04 NOTE — Progress Notes (Signed)
 Name: Jeffrey Harding   MRN: [id number]    DOB: [birth year]   Date:04/04/2024       Progress Note  Subjective  Chief Complaint  Chief Complaint  Patient presents with   Medical Management of Chronic Issues   HPI   Dyslipidemia: he was on Atorvastatin  for many years, however last time his LFTs spiked, he had a change in insurance and was seen at [facility] early 2021, had multiple tests and ferritin was high, he was advised to stay off statin therapy, LFT's improved, but CK has remained elevated. He is now drinking more water , staying active and last CK improved. Reviewed last labs with patient and will continue to monitor for now    The 10-year ASCVD risk score (Arnett DK, et al., 2019) is: 9.2%   Values used to calculate the score:     Age: 63 years     Clincally relevant sex: Male     Is Non-Hispanic African American: Yes     Diabetic: No     Tobacco smoker: No     Systolic Blood Pressure: 122 mmHg     Is BP treated: No     HDL Cholesterol: 40 mg/dL     Total Cholesterol: 185 mg/dL   Vitamin D  deficiency : he is taking otc supplementation . Last level at goal    Pre-diabetes: he is cutting down on sodas, bread, sweets and lost 9 lbs since April. He is also exercising more and is feeling well.    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    Past Surgical History:  Procedure Laterality Date   COLONOSCOPY     COLONOSCOPY WITH PROPOFOL  N/A 01/13/2022   Procedure: COLONOSCOPY WITH PROPOFOL ;  Surgeon: Therisa Bi,  MD;  Location: [facility] ENDOSCOPY;  Service: Gastroenterology;  Laterality: N/A;   CYST EXCISION  09/19/2007   back   LASER OF PROSTATE W/ GREEN LIGHT PVP  09/18/2006   PENILE PROSTHESIS IMPLANT     PENILE PROSTHESIS IMPLANT N/A 04/24/2023   Procedure: REMOVAL AND REPLACEMENT OF INFLATABLE PENILE PROTHESIS;  Surgeon: Lovie Arlyss CROME, MD;  Location: [facility] ORS;  Service: Urology;  Laterality: N/A;  135 MINUTES NEEDED FOR CASE   PROSTATECTOMY     TONSILECTOMY/ADENOIDECTOMY WITH MYRINGOTOMY      Family History  Problem Relation Age of Onset   Hypertension Mother    Hyperlipidemia Mother    Heart disease Mother    Diabetes Mother    Heart disease Father    Hyperlipidemia Father    Hypertension Father    Prostate cancer Father    Cancer Father    Hypertension Sister    Hyperlipidemia Sister    Lupus Sister    Hypertension Sister     Social History   Tobacco Use   Smoking status: Never   Smokeless tobacco: Never  Substance Use Topics   Alcohol use: Yes    Comment: rarely     Current Outpatient Medications:    Ascorbic Acid (VITAMIN C  PO), Take 1  capsule by mouth daily., Disp: , Rfl:    loratadine (CLARITIN) 10 MG tablet, Take 10 mg by mouth daily as needed for allergies., Disp: , Rfl:    omeprazole  (PRILOSEC) 40 MG capsule, Take 40 mg by mouth as needed (acid reflux)., Disp: , Rfl:    VITAMIN D  PO, Take 1 capsule by mouth daily., Disp: , Rfl:   No Known Allergies  I personally reviewed active problem list, medication list, allergies with the patient/caregiver today.   ROS  Ten systems reviewed and is negative except as mentioned in HPI    Objective Physical Exam Constitutional: Patient appears well-developed and well-nourished.  No distress.  HEENT: head atraumatic, normocephalic, pupils equal and reactive to light, neck supple Cardiovascular: Normal rate, regular rhythm and normal heart sounds.  No murmur heard. No BLE edema. Pulmonary/Chest: Effort normal and breath  sounds normal. No respiratory distress. Abdominal: Soft.  There is no tenderness. Psychiatric: Patient has a normal mood and affect. behavior is normal. Judgment and thought content normal.   Vitals:   04/04/24 1054  BP: 122/80  Pulse: 83  Resp: 16  SpO2: 95%  Weight: 180 lb 11.2 oz (82 kg)  Height: 5' 9 (1.753 m)    Body mass index is 26.68 kg/m.    PHQ2/9:    04/04/2024   10:50 AM 12/21/2023    1:10 PM 04/06/2023    9:25 AM 01/05/2023   11:11 AM 12/08/2022    8:06 AM  Depression screen PHQ 2/9  Decreased Interest 0 0 0 0 0  Down, Depressed, Hopeless 0 0 0 0 0  PHQ - 2 Score 0 0 0 0 0  Altered sleeping  0 0 0 0  Tired, decreased energy  0 0 0 0  Change in appetite  0 0 0 0  Feeling bad or failure about yourself   0 0 0 0  Trouble concentrating  0 0 0 0  Moving slowly or fidgety/restless  0 0 0 0  Suicidal thoughts  0 0 0 0  PHQ-9 Score  0 0 0 0  Difficult doing work/chores  Not difficult at all   Not difficult at all    phq 9 is negative  Fall Risk:    04/04/2024   10:50 AM 04/06/2023    9:25 AM 01/05/2023   11:10 AM 12/08/2022    8:06 AM 06/09/2022    7:44 AM  Fall Risk   Falls in the past year? 0 0 0 0 1  Number falls in past yr: 0 0 0 0 0  Injury with Fall? 0 0 0 0 0  Risk for fall due to : No Fall Risks No Fall Risks No Fall Risks  No Fall Risks  Follow up Falls evaluation completed Falls prevention discussed Falls prevention discussed  Falls prevention discussed      Data saved with a previous flowsheet row definition     Assessment & Plan  1. Pre-diabetes (Primary)  On life style modifications  2. Dyslipidemia  No on medications due to elevated CK and ASCVD is below 10 %  3. History of prostatectomy  Due to prostate cancer  4. Vitamin D  deficiency  Continue supplements  5. GERD without esophagitis  Taking PPI prn  6. H/O malignant neoplasm of prostate  Doing well   7. Sebaceous cyst  Seen by surgeon and will have it removed in 2  weeks

## 2024-04-04 NOTE — Patient Instructions (Signed)
Skin Abscess  A skin abscess is an infected spot of skin. It can have pus in it. An abscess can happen in any part of your body. Some abscesses break open (rupture) on their own. Most keep getting worse unless they are treated. If your abscess is not treated, the infection can spread deeper into your body and blood. This can make you feel sick. What are the causes? Germs that enter your skin. This may happen if you have: A cut or scrape. A wound from a needle or an insect bite. Blocked oil or sweat glands. A problem with the spot where your hair goes into your skin. A fluid-filled sac called a cyst under your skin. What increases the risk? Having problems with how your blood moves through your body. Having a weak body defense system (immune system). Having diabetes. Having dry and irritated skin. Needing to get shots often. Putting drugs into your body with a needle. Having a splinter or something else in your skin. Smoking. What are the signs or symptoms? A firm bump under your skin that hurts. A bump with pus at the top. Redness and swelling. Warm or tender spots. A sore on the skin. How is this treated? You may need to: Put a heat pack or a warm, wet washcloth on the spot. Have the pus drained. Take antibiotics. Follow these instructions at home: Medicines Take over-the-counter and prescription medicines only as told by your doctor. If you were prescribed antibiotics, take them as told by your doctor. Do not stop taking them even if you start to feel better. Abscess care  If you have an abscess that has not drained, put heat on it. Use the heat source that your doctor recommends, such as a moist heat pack or a heating pad. Place a towel between your skin and the heat source. Leave the heat on for 20-30 minutes. If your skin turns bright red, take off the heat right away to prevent burns. The risk of burns is higher if you cannot feel pain, heat, or cold. Follow  instructions from your doctor about how to take care of your abscess. Make sure you: Cover the abscess with a bandage. Wash your hands with soap and water for at least 20 seconds before and after you change your bandage. If you cannot use soap and water, use hand sanitizer. Change your bandage as told by your doctor. Check your abscess every day for signs that the infection is getting worse. Check for: More redness, swelling, or pain. More fluid or blood. Warmth. More pus or a worse smell. General instructions To keep the infection from spreading: Do not share personal items or towels. Do not go in a hot tub with others. Avoid making skin contact with others. Be careful when you get rid of used bandages or any pus from the abscess. Do not smoke or use any products that contain nicotine or tobacco. If you need help quitting, ask your doctor. Contact a doctor if: You see red streaks on your skin near the abscess. You have any signs of worse infection. You vomit every time you eat or drink. You have a fever, chills, or muscle aches. The cyst or abscess comes back. Get help right away if: You have very bad pain. You make less pee (urine) than normal. This information is not intended to replace advice given to you by your health care provider. Make sure you discuss any questions you have with your health care provider. Document Revised: 04/19/2022  Document Reviewed: 04/19/2022 Elsevier Patient Education  2024 [employer].

## 2024-04-16 ENCOUNTER — Ambulatory Visit: Admitting: Surgery

## 2024-04-21 ENCOUNTER — Encounter: Payer: Self-pay | Admitting: Surgery

## 2024-04-21 ENCOUNTER — Ambulatory Visit (INDEPENDENT_AMBULATORY_CARE_PROVIDER_SITE_OTHER): Admitting: Surgery

## 2024-04-21 VITALS — BP 167/93 | HR 73 | Temp 98.6°F | Ht 69.0 in | Wt 179.0 lb

## 2024-04-21 DIAGNOSIS — L720 Epidermal cyst: Secondary | ICD-10-CM | POA: Diagnosis not present

## 2024-04-21 DIAGNOSIS — L723 Sebaceous cyst: Secondary | ICD-10-CM | POA: Diagnosis not present

## 2024-04-21 NOTE — Patient Instructions (Signed)
 We have removed a Cyst in our office today.  You have sutures under the skin that will dissolve and also dermabond (skin glue) on top of your skin which will come off on it's own in 10-14 days.  You may use Ibuprofen or Tylenol  as needed for pain control. Use the ice pack 3-4 times a day for the next two days for any achiness.  You may shower 04/22/2024. Do not scrub at the area.   Avoid Strenuous activities that will make you sweat during the next 48 hours to avoid the glue coming off prematurely. Avoid activities that will place pressure to this area of the body for 1-2 weeks to avoid re-injury to incision site.  Please see your follow-up appointment provided. We will see you back in office to make sure this area is healed and to review the final pathology. If you have any questions or concerns prior to this appointment, call our office and speak with a nurse.    Excision of Skin Cysts or Lesions Excision of a skin lesion refers to the removal of a section of skin by making small cuts (incisions) in the skin. This procedure may be done to remove a cancerous (malignant) or noncancerous (benign) growth on the skin. It is typically done to treat or prevent cancer or infection. It may also be done to improve cosmetic appearance. The procedure may be done to remove: Cancerous growths, such as basal cell carcinoma, squamous cell carcinoma, or melanoma. Noncancerous growths, such as a cyst or lipoma. Growths, such as moles or skin tags, which may be removed for cosmetic reasons.  Various excision or surgical techniques may be used depending on your condition, the location of the lesion, and your overall health. Tell a health care provider about: Any allergies you have. All medicines you are taking, including vitamins, herbs, eye drops, creams, and over-the-counter medicines. Any problems you or family members have had with anesthetic medicines. Any blood disorders you have. Any surgeries you  have had. Any medical conditions you have. Whether you are pregnant or may be pregnant. What are the risks? Generally, this is a safe procedure. However, problems may occur, including: Bleeding. Infection. Scarring. Recurrence of the cyst, lipoma, or cancer. Changes in skin sensation or appearance, such as discoloration or swelling. Reaction to the anesthetics. Allergic reaction to surgical materials or ointments. Damage to nerves, blood vessels, muscles, or other structures. Continued pain.  What happens before the procedure? Ask your health care provider about: Changing or stopping your regular medicines. This is especially important if you are taking diabetes medicines or blood thinners. Taking medicines such as aspirin  and ibuprofen. These medicines can thin your blood. Do not take these medicines before your procedure if your health care provider instructs you not to. You may be asked to take certain medicines. You may be asked to stop smoking. You may have an exam or testing. What happens during the procedure? To reduce your risk of infection: Your health care team will wash or sanitize their hands. Your skin will be washed with soap. You will be given a medicine to numb the area (local anesthetic). One of the following excision techniques will be performed. At the end of any of these procedures, antibiotic ointment will be applied as needed. Each of the following techniques may vary among health care providers and hospitals. Complete Surgical Excision The area of skin that needs to be removed will be marked with a pen. Using a small scalpel or scissors, the  surgeon will gently cut around and under the lesion until it is completely removed. The lesion will be placed in a fluid and sent to the lab for examination. If necessary, bleeding will be controlled with a device that delivers heat (electrocautery). The edges of the wound may be stitched (sutured) together, and a bandage  (dressing) or surgical glue will be applied. This procedure may be performed to treat a cancerous growth or a noncancerous cyst or lesion.  What happens after the procedure? Return to your normal activities as told by your health care provider. Report any excessive bleeding, spreading redness, or increased pain.

## 2024-04-21 NOTE — Progress Notes (Signed)
  Procedure Date:  04/21/2024  Pre-operative Diagnosis:  Sebaceous cyst of back  Post-operative Diagnosis:  Sebaceous cyst of back, 3.2 x 2.2 cm.  Procedure:  Excision of sebaceous cyst of back; layered closure of 4 cm incision.  Surgeon:  Aloysius Sheree Plant, MD  Anesthesia:  General endotracheal  Estimated Blood Loss:  5 ml  Specimens:  sebaceous cyst of back  Complications:  None  Indications for Procedure:  This is a 63 y.o. male with diagnosis of a symptomatic sebaceous cyst of the back.  The patient wishes to have this excised. The risks of bleeding, abscess or infection, injury to surrounding structures, and need for further procedures were all discussed with the patient and he was willing to proceed.  Description of Procedure: The patient was correctly identified at bedside.  The patient was placed supine.  Appropriate time-outs were performed.  The patient's back was prepped and draped in usual sterile fashion.  Local anesthetic was infused intradermally.  A 4 cm elliptical incision was made over the cyst, and scalpel was used to dissect down the skin and subcutaneous tissue.  Skin flaps were created sharply, and then the cyst was excised intact.  It was sent off to pathology.  The cavity was then irrigated and hemostasis was assured with manual pressure.  The wound was then closed in two layers using 3-0 Vicryl and 4-0 Monocryl.  The incision was cleaned and sealed with DermaBond.  The patient tolerated the procedure well and all sharps were appropriately disposed of at the end of the case.  --Patient may shower tomorrow --May take Tylenol  or ibuprofen for pain control --Discussed activity restrictions. --Follow up next week.  Aloysius Sheree Plant, MD

## 2024-04-23 LAB — SURGICAL PATHOLOGY

## 2024-05-02 ENCOUNTER — Ambulatory Visit (INDEPENDENT_AMBULATORY_CARE_PROVIDER_SITE_OTHER): Admitting: Surgery

## 2024-05-02 ENCOUNTER — Encounter: Payer: Self-pay | Admitting: Surgery

## 2024-05-02 VITALS — BP 156/82 | HR 83 | Temp 98.3°F | Ht 69.0 in | Wt 179.4 lb

## 2024-05-02 DIAGNOSIS — L720 Epidermal cyst: Secondary | ICD-10-CM

## 2024-05-02 DIAGNOSIS — Z09 Encounter for follow-up examination after completed treatment for conditions other than malignant neoplasm: Secondary | ICD-10-CM | POA: Diagnosis not present

## 2024-05-02 NOTE — Progress Notes (Signed)
 05/02/2024  HPI: Jeffrey Harding is a 63 y.o. male s/p excision of back sebaceous cyst on 04/21/2024.  Patient presents today for follow-up.  He reports that he has been doing well and denies any pain issues at the site and denies any drainage.  Vital signs: BP (!) 156/82   Pulse 83   Temp 98.3 F (36.8 C) (Oral)   Ht 5' 9 (1.753 m)   Wt 179 lb 6.4 oz (81.4 kg)   SpO2 98%   BMI 26.49 kg/m    Physical Exam: Constitutional: No acute distress. Skin: Back incision is healing well and is clean, dry, intact.  Dermabond starting to peel off.  No evidence of infection.  Assessment/Plan: This is a 63 y.o. male s/p excision of back sebaceous cyst.  - Patient is recovering well from his procedure.  Reviewed with him pathology results showing an epidermal inclusion cyst.  The wound is healing well. - Discussed with him still with some activity restrictions particularly when it comes to strenuous workout exercises. - Follow-up as needed.   Aloysius Sheree Plant, MD  [facility]

## 2024-05-02 NOTE — Patient Instructions (Signed)
 Excision of Skin Lesions, Care After The following information offers guidance on how to care for yourself after your procedure. Your health care provider may also give you more specific instructions. If you have problems or questions, contact your health care provider. What can I expect after the procedure? After your procedure, it is common to have: Soreness or mild pain. Some redness and swelling. Follow these instructions at home: Excision site care  Follow instructions from your health care provider about how to take care of your excision site. Make sure you: Wash your hands with soap and water for at least 20 seconds before and after you change your bandage (dressing). If soap and water are not available, use hand sanitizer. Change your dressing as told by your health care provider. Leave stitches (sutures), skin glue, or adhesive strips in place. These skin closures may need to stay in place for 2 weeks or longer. If adhesive strip edges start to loosen and curl up, you may trim the loose edges. Do not remove adhesive strips completely unless your health care provider tells you to do that. Check the excision area every day for signs of infection. Watch for: More redness, swelling, or pain. Fluid or blood. Warmth. Pus or a bad smell. Keep the site clean, dry, and protected for at least 48 hours. For bleeding, apply gentle but firm pressure to the area using a folded towel for 20 minutes. Do not take baths, swim, or use a hot tub until your health care provider approves. Ask your health care provider if you may take showers. You may only be allowed to take sponge baths. General instructions Take over-the-counter and prescription medicines only as told by your health care provider. Follow instructions from your health care provider about how to minimize scarring. Scarring should lessen over time. Avoid sun exposure until the area has healed. Use sunscreen to protect the area from the sun  after it has healed. Avoid high-impact exercise and activities until the sutures are removed or the area heals. Keep all follow-up visits. This is important. Contact a health care provider if: You have more redness, swelling, or pain around your excision site. You have fluid or blood coming from your excision site. Your excision site feels warm to the touch. You have pus or a bad smell coming from your excision site. You have a fever. You have pain that does not improve in 2-3 days after your procedure. Get help right away if: You have bleeding that does not stop with pressure or a dressing. Your wound opens up. Summary Take over-the-counter and prescription medicines only as told by your health care provider. Change your dressing as told by your health care provider. Contact a health care provider if you have redness, swelling, pain, or other signs of infection around your excision site. Keep all follow-up visits. This is important. This information is not intended to replace advice given to you by your health care provider. Make sure you discuss any questions you have with your health care provider. Document Revised: 04/04/2021 Document Reviewed: 04/05/2021 Elsevier Patient Education  2025 [employer].

## 2024-12-26 ENCOUNTER — Encounter: Admitting: Family Medicine
